# Patient Record
Sex: Male | Born: 1945 | Race: White | Hispanic: No | Marital: Married | Smoking: Former smoker
Health system: Southern US, Community
[De-identification: ages and names within clinical notes are randomized; demographics above are authoritative.]

## PROBLEM LIST (undated history)

## (undated) DIAGNOSIS — M79606 Pain in leg, unspecified: Secondary | ICD-10-CM

## (undated) DIAGNOSIS — F32A Depression, unspecified: Secondary | ICD-10-CM

## (undated) DIAGNOSIS — C7951 Secondary malignant neoplasm of bone: Secondary | ICD-10-CM

## (undated) DIAGNOSIS — N529 Male erectile dysfunction, unspecified: Secondary | ICD-10-CM

## (undated) DIAGNOSIS — G8929 Other chronic pain: Secondary | ICD-10-CM

## (undated) DIAGNOSIS — F329 Major depressive disorder, single episode, unspecified: Secondary | ICD-10-CM

## (undated) DIAGNOSIS — F0390 Unspecified dementia without behavioral disturbance: Secondary | ICD-10-CM

## (undated) DIAGNOSIS — C61 Malignant neoplasm of prostate: Secondary | ICD-10-CM

## (undated) HISTORY — PX: OTHER SURGICAL HISTORY: SHX169

## (undated) HISTORY — PX: MANDIBLE FRACTURE SURGERY: SHX706

---

## 2009-02-24 ENCOUNTER — Emergency Department (HOSPITAL_BASED_OUTPATIENT_CLINIC_OR_DEPARTMENT_OTHER): Admission: EM | Admit: 2009-02-24 | Discharge: 2009-02-24 | Payer: Self-pay | Admitting: Emergency Medicine

## 2009-02-24 ENCOUNTER — Ambulatory Visit: Payer: Self-pay | Admitting: Diagnostic Radiology

## 2010-04-24 LAB — URINALYSIS, ROUTINE W REFLEX MICROSCOPIC
Hgb urine dipstick: NEGATIVE
Nitrite: NEGATIVE
Protein, ur: NEGATIVE mg/dL
Specific Gravity, Urine: 1.034 — ABNORMAL HIGH (ref 1.005–1.030)
Urobilinogen, UA: 1 mg/dL (ref 0.0–1.0)

## 2010-04-24 LAB — COMPREHENSIVE METABOLIC PANEL
ALT: 34 U/L (ref 0–53)
AST: 24 U/L (ref 0–37)
Albumin: 4.6 g/dL (ref 3.5–5.2)
Alkaline Phosphatase: 65 U/L (ref 39–117)
Sodium: 143 mEq/L (ref 135–145)
Total Protein: 7.5 g/dL (ref 6.0–8.3)

## 2015-04-28 ENCOUNTER — Encounter (HOSPITAL_BASED_OUTPATIENT_CLINIC_OR_DEPARTMENT_OTHER): Payer: Self-pay | Admitting: *Deleted

## 2015-04-28 ENCOUNTER — Inpatient Hospital Stay (HOSPITAL_BASED_OUTPATIENT_CLINIC_OR_DEPARTMENT_OTHER)
Admission: EM | Admit: 2015-04-28 | Discharge: 2015-05-06 | DRG: 481 | Disposition: A | Discharge status: Skilled Nursing Facility | Payer: Medicare Other | Attending: Internal Medicine | Admitting: Internal Medicine

## 2015-04-28 ENCOUNTER — Emergency Department (HOSPITAL_BASED_OUTPATIENT_CLINIC_OR_DEPARTMENT_OTHER): Payer: Medicare Other

## 2015-04-28 DIAGNOSIS — S72001A Fracture of unspecified part of neck of right femur, initial encounter for closed fracture: Secondary | ICD-10-CM

## 2015-04-28 DIAGNOSIS — D6959 Other secondary thrombocytopenia: Secondary | ICD-10-CM | POA: Diagnosis present

## 2015-04-28 DIAGNOSIS — C61 Malignant neoplasm of prostate: Secondary | ICD-10-CM | POA: Diagnosis present

## 2015-04-28 DIAGNOSIS — F329 Major depressive disorder, single episode, unspecified: Secondary | ICD-10-CM | POA: Diagnosis present

## 2015-04-28 DIAGNOSIS — S72009A Fracture of unspecified part of neck of unspecified femur, initial encounter for closed fracture: Secondary | ICD-10-CM | POA: Diagnosis present

## 2015-04-28 DIAGNOSIS — S7290XA Unspecified fracture of unspecified femur, initial encounter for closed fracture: Secondary | ICD-10-CM | POA: Diagnosis present

## 2015-04-28 DIAGNOSIS — Z87891 Personal history of nicotine dependence: Secondary | ICD-10-CM

## 2015-04-28 DIAGNOSIS — C7951 Secondary malignant neoplasm of bone: Secondary | ICD-10-CM | POA: Diagnosis present

## 2015-04-28 DIAGNOSIS — G8929 Other chronic pain: Secondary | ICD-10-CM | POA: Diagnosis present

## 2015-04-28 DIAGNOSIS — M84451A Pathological fracture, right femur, initial encounter for fracture: Principal | ICD-10-CM | POA: Diagnosis present

## 2015-04-28 DIAGNOSIS — M898X9 Other specified disorders of bone, unspecified site: Secondary | ICD-10-CM | POA: Insufficient documentation

## 2015-04-28 DIAGNOSIS — R627 Adult failure to thrive: Secondary | ICD-10-CM | POA: Diagnosis present

## 2015-04-28 DIAGNOSIS — K5900 Constipation, unspecified: Secondary | ICD-10-CM | POA: Diagnosis present

## 2015-04-28 DIAGNOSIS — M25561 Pain in right knee: Secondary | ICD-10-CM | POA: Diagnosis not present

## 2015-04-28 DIAGNOSIS — M8440XA Pathological fracture, unspecified site, initial encounter for fracture: Secondary | ICD-10-CM | POA: Diagnosis present

## 2015-04-28 DIAGNOSIS — D62 Acute posthemorrhagic anemia: Secondary | ICD-10-CM | POA: Diagnosis not present

## 2015-04-28 DIAGNOSIS — F32A Depression, unspecified: Secondary | ICD-10-CM | POA: Diagnosis present

## 2015-04-28 DIAGNOSIS — S8990XA Unspecified injury of unspecified lower leg, initial encounter: Secondary | ICD-10-CM

## 2015-04-28 DIAGNOSIS — Z419 Encounter for procedure for purposes other than remedying health state, unspecified: Secondary | ICD-10-CM

## 2015-04-28 DIAGNOSIS — F0390 Unspecified dementia without behavioral disturbance: Secondary | ICD-10-CM | POA: Diagnosis present

## 2015-04-28 DIAGNOSIS — M79606 Pain in leg, unspecified: Secondary | ICD-10-CM

## 2015-04-28 DIAGNOSIS — Z66 Do not resuscitate: Secondary | ICD-10-CM | POA: Insufficient documentation

## 2015-04-28 DIAGNOSIS — Z515 Encounter for palliative care: Secondary | ICD-10-CM | POA: Insufficient documentation

## 2015-04-28 DIAGNOSIS — G4730 Sleep apnea, unspecified: Secondary | ICD-10-CM | POA: Diagnosis present

## 2015-04-28 DIAGNOSIS — N179 Acute kidney failure, unspecified: Secondary | ICD-10-CM | POA: Diagnosis present

## 2015-04-28 HISTORY — DX: Depression, unspecified: F32.A

## 2015-04-28 HISTORY — DX: Unspecified dementia, unspecified severity, without behavioral disturbance, psychotic disturbance, mood disturbance, and anxiety: F03.90

## 2015-04-28 HISTORY — DX: Major depressive disorder, single episode, unspecified: F32.9

## 2015-04-28 HISTORY — DX: Pain in leg, unspecified: M79.606

## 2015-04-28 HISTORY — DX: Secondary malignant neoplasm of bone: C61

## 2015-04-28 HISTORY — DX: Other chronic pain: G89.29

## 2015-04-28 HISTORY — DX: Secondary malignant neoplasm of bone: C79.51

## 2015-04-28 HISTORY — DX: Male erectile dysfunction, unspecified: N52.9

## 2015-04-28 NOTE — ED Provider Notes (Signed)
CSN: [id number]     Arrival date & time 04/28/15  2147 History   First MD Initiated Contact with Patient 04/28/15 2327     Chief Complaint  Patient presents with  . Leg Injury     (Consider location/radiation/quality/duration/timing/severity/associated sxs/prior Treatment) HPI Comments: Patient with a history of metastatic prostate cancer was reaching for the phone while lying in bed this evening when he felt and heard a "pop" in his right leg.  He noted swelling of the inner aspect of the thigh just after the incident.  He was able to ambulate down the stairs with assistance.  Patient recently completed a PET scan and MRI at the [place]. He has not yet been notified of the results.  Patient is a 70 y.o. male presenting with leg pain.  Leg Pain Location:  Leg Leg location:  R leg Pain details:    Quality:  Unable to specify   Severity:  Mild   Onset quality:  Sudden Chronicity:  New   Past Medical History  Diagnosis Date  . Cancer [facility])    History reviewed. No pertinent past surgical history. No family history on file. Social History  Substance Use Topics  . Smoking status: Never Smoker   . Smokeless tobacco: None  . Alcohol Use: No    Review of Systems  Musculoskeletal: Positive for arthralgias.  All other systems reviewed and are negative.     Allergies  Review of patient's allergies indicates no known allergies.  Home Medications   Prior to Admission medications   Medication Sig Start Date End Date Taking? Authorizing Provider  GABAPENTIN, ONCE-DAILY, PO Take by mouth.   Yes Historical Provider, MD   BP 112/62 mmHg  Pulse 72  Temp(Src) 97.8 F (36.6 C) (Oral)  Resp 18  Ht 5\' 10"  (1.778 m)  Wt 74.844 kg  BMI 23.68 kg/m2  SpO2 95% Physical Exam  Constitutional: He is oriented to person, place, and time. He appears well-developed and well-nourished.  HENT:  Head: Normocephalic and atraumatic.  Eyes: Pupils are equal, round, and reactive to light.  Neck:  Neck supple.  Cardiovascular: Normal rate and regular rhythm.   Pulmonary/Chest: Effort normal and breath sounds normal.  Abdominal: Soft.  Musculoskeletal: He exhibits edema and tenderness.  Neurological: He is alert and oriented to person, place, and time.  Skin: Skin is warm and dry.  Psychiatric: He has a normal mood and affect.  Nursing note and vitals reviewed.   ED Course  Procedures (including critical care time) Labs Review Labs Reviewed - No data to display  Imaging Review Ct Hip Right Wo Contrast  04/29/2015  CLINICAL DATA:  Right hip pain. Lesser trochanter fracture on plain films. EXAM: CT OF THE RIGHT HIP WITHOUT CONTRAST TECHNIQUE: Multidetector CT imaging of the right hip was performed according to the standard protocol. Multiplanar CT image reconstructions were also generated. COMPARISON:  Right femur 04/28/2015 FINDINGS: Again demonstrated is a fracture of the lesser trochanter with superior and medial displacement of the lesser trochanteric fragment. In addition, there is a linear lucency through the base of the femoral neck consistent with a nondisplaced fracture. Linear lucency also extends obliquely across the base of the greater trochanter. No evidence of dislocation. On at 50 diffuse abnormal bone density is demonstrated with a heterogeneous sclerosis and lucency with irregular margination and irregular cortex demonstrated throughout the visualized right pelvis and with heterogeneous lucency and sclerosis in the femoral head and neck. This is consistent with underlying infiltrative bone process.  Suspect metastatic involvement although myeloma or lymphoma could also possibly have this appearance. Enlarged lymph nodes are demonstrated in the right iliac chains, measuring up to 23 mm short axis dimension. Musculature medial to the proximal femur is somewhat prominent with hazy margins, suggesting intramuscular hematoma. IMPRESSION: Displaced fracture of the lesser trochanter.  Additional linear nondisplaced fractures of the base of the femoral neck and of the base of the greater trochanter. There is underlying abnormal bone mineralization consistent with pathologic fractures. Bone sclerosis and lucency with cortical irregularity may indicate metastasis, myeloma, or other infiltrating bone process. Enlarged lymph nodes are present in the right iliac chains suggesting metastatic lymphadenopathy. Electronically Signed   By: Lucienne Capers M.D.   On: 04/29/2015 02:36   Dg Knee Complete 4 Views Right  04/28/2015  CLINICAL DATA:  70 year old male with right knee pain after feeling a pop in the right leg. EXAM: RIGHT KNEE - COMPLETE 4+ VIEW COMPARISON:  None. FINDINGS: There is no acute fracture or dislocation. There is mild osteopenia. No significant arthritic changes. There is no joint effusion. The soft tissues appear unremarkable. IMPRESSION: No acute fracture or dislocation. Electronically Signed   By: Anner Crete M.D.   On: 04/28/2015 23:13   Dg Femur, Min 2 Views Right  04/28/2015  CLINICAL DATA:  Right femoral pain and tightness after making a sudden movement while lying down. EXAM: RIGHT FEMUR 2 VIEWS COMPARISON:  None. FINDINGS: There is a fracture of the lesser trochanter of the right hip with superior displacement of the fracture fragment. No evidence of femoral neck, shaft, or distal femoral fractures. Heterogeneous sclerotic appearance of the right superior and inferior pubic rami suggesting underlying bone disease. This could represent Paget's disease or metastasis. This is possibly a pathologic fracture. IMPRESSION: Displaced fracture of the lesser trochanter of the right femur, possibly pathologic. Abnormal bone sclerosis in the superior and inferior pubic rami suggesting Paget's disease or infiltrating bone disease such as metastasis. Electronically Signed   By: Lucienne Capers M.D.   On: 04/28/2015 23:13   I have personally reviewed and evaluated these images  and lab results as part of my medical decision-making.   EKG Interpretation None     Discussed patient with orthopedics ([facility]) after reviewing plain films--lesser trochanter fracture noted.  He requests CT of hip to further assess for occult fractures. CT reveals displaced fracture of lesser trochanter, along with linear non-displaced fractures of base of femoral neck and base of greater trochanter. Patient to be admitted to medicine at [facility] per request of orthopedist.  MDM   Final diagnoses:  Leg injury   Hip fracture. Admitted to medicine. Ortho to see in the morning.     Etta Quill, NP 04/29/15 [place], MD 04/29/15 [phone]

## 2015-04-28 NOTE — ED Notes (Signed)
He was laying in bed trying to reach for the phone and felt a pop in his right upper leg. Swelling and pain above his knee. He was able to walk down stairs with assistance afterward.

## 2015-04-29 ENCOUNTER — Emergency Department (HOSPITAL_BASED_OUTPATIENT_CLINIC_OR_DEPARTMENT_OTHER): Payer: Medicare Other

## 2015-04-29 ENCOUNTER — Encounter (HOSPITAL_COMMUNITY): Payer: Self-pay | Admitting: Internal Medicine

## 2015-04-29 ENCOUNTER — Inpatient Hospital Stay (HOSPITAL_COMMUNITY): Payer: Medicare Other | Admitting: Anesthesiology

## 2015-04-29 ENCOUNTER — Inpatient Hospital Stay (HOSPITAL_COMMUNITY): Payer: Medicare Other

## 2015-04-29 ENCOUNTER — Inpatient Hospital Stay (HOSPITAL_BASED_OUTPATIENT_CLINIC_OR_DEPARTMENT_OTHER): Payer: Medicare Other

## 2015-04-29 ENCOUNTER — Encounter (HOSPITAL_COMMUNITY)
Admission: EM | Disposition: A | Discharge status: Skilled Nursing Facility | Payer: Self-pay | Source: Home / Self Care | Attending: Internal Medicine

## 2015-04-29 DIAGNOSIS — C61 Malignant neoplasm of prostate: Secondary | ICD-10-CM | POA: Diagnosis present

## 2015-04-29 DIAGNOSIS — S7290XA Unspecified fracture of unspecified femur, initial encounter for closed fracture: Secondary | ICD-10-CM | POA: Diagnosis present

## 2015-04-29 DIAGNOSIS — D62 Acute posthemorrhagic anemia: Secondary | ICD-10-CM | POA: Diagnosis not present

## 2015-04-29 DIAGNOSIS — F0390 Unspecified dementia without behavioral disturbance: Secondary | ICD-10-CM | POA: Diagnosis present

## 2015-04-29 DIAGNOSIS — D6959 Other secondary thrombocytopenia: Secondary | ICD-10-CM | POA: Diagnosis present

## 2015-04-29 DIAGNOSIS — F329 Major depressive disorder, single episode, unspecified: Secondary | ICD-10-CM | POA: Diagnosis present

## 2015-04-29 DIAGNOSIS — K5901 Slow transit constipation: Secondary | ICD-10-CM | POA: Diagnosis not present

## 2015-04-29 DIAGNOSIS — Z66 Do not resuscitate: Secondary | ICD-10-CM | POA: Diagnosis not present

## 2015-04-29 DIAGNOSIS — F32A Depression, unspecified: Secondary | ICD-10-CM | POA: Diagnosis present

## 2015-04-29 DIAGNOSIS — K5900 Constipation, unspecified: Secondary | ICD-10-CM | POA: Diagnosis present

## 2015-04-29 DIAGNOSIS — M79606 Pain in leg, unspecified: Secondary | ICD-10-CM | POA: Diagnosis not present

## 2015-04-29 DIAGNOSIS — S72009A Fracture of unspecified part of neck of unspecified femur, initial encounter for closed fracture: Secondary | ICD-10-CM | POA: Diagnosis present

## 2015-04-29 DIAGNOSIS — R627 Adult failure to thrive: Secondary | ICD-10-CM | POA: Diagnosis present

## 2015-04-29 DIAGNOSIS — S72001A Fracture of unspecified part of neck of right femur, initial encounter for closed fracture: Secondary | ICD-10-CM | POA: Diagnosis present

## 2015-04-29 DIAGNOSIS — M25561 Pain in right knee: Secondary | ICD-10-CM | POA: Diagnosis present

## 2015-04-29 DIAGNOSIS — G4730 Sleep apnea, unspecified: Secondary | ICD-10-CM | POA: Diagnosis present

## 2015-04-29 DIAGNOSIS — Z87891 Personal history of nicotine dependence: Secondary | ICD-10-CM | POA: Diagnosis not present

## 2015-04-29 DIAGNOSIS — G8929 Other chronic pain: Secondary | ICD-10-CM | POA: Diagnosis present

## 2015-04-29 DIAGNOSIS — M84451A Pathological fracture, right femur, initial encounter for fracture: Secondary | ICD-10-CM | POA: Diagnosis present

## 2015-04-29 DIAGNOSIS — M8440XA Pathological fracture, unspecified site, initial encounter for fracture: Secondary | ICD-10-CM

## 2015-04-29 DIAGNOSIS — Z515 Encounter for palliative care: Secondary | ICD-10-CM | POA: Diagnosis not present

## 2015-04-29 DIAGNOSIS — C7951 Secondary malignant neoplasm of bone: Secondary | ICD-10-CM

## 2015-04-29 DIAGNOSIS — S72001D Fracture of unspecified part of neck of right femur, subsequent encounter for closed fracture with routine healing: Secondary | ICD-10-CM | POA: Diagnosis not present

## 2015-04-29 DIAGNOSIS — N179 Acute kidney failure, unspecified: Secondary | ICD-10-CM | POA: Diagnosis present

## 2015-04-29 DIAGNOSIS — M899 Disorder of bone, unspecified: Secondary | ICD-10-CM | POA: Diagnosis not present

## 2015-04-29 DIAGNOSIS — M8440XD Pathological fracture, unspecified site, subsequent encounter for fracture with routine healing: Secondary | ICD-10-CM | POA: Diagnosis not present

## 2015-04-29 HISTORY — PX: INTRAMEDULLARY (IM) NAIL INTERTROCHANTERIC: SHX5875

## 2015-04-29 LAB — HEPATIC FUNCTION PANEL
ALK PHOS: 177 U/L — AB (ref 38–126)
ALT: 18 U/L (ref 17–63)
AST: 45 U/L — ABNORMAL HIGH (ref 15–41)
Albumin: 3.5 g/dL (ref 3.5–5.0)
BILIRUBIN DIRECT: 0.1 mg/dL (ref 0.1–0.5)
BILIRUBIN INDIRECT: 0.4 mg/dL (ref 0.3–0.9)
BILIRUBIN TOTAL: 0.5 mg/dL (ref 0.3–1.2)
TOTAL PROTEIN: 6.3 g/dL — AB (ref 6.5–8.1)

## 2015-04-29 LAB — CREATININE, SERUM
Creatinine, Ser: 0.96 mg/dL (ref 0.61–1.24)
GFR calc Af Amer: >60 mL/min (ref >60)
GFR calc non Af Amer: >60 mL/min (ref >60)

## 2015-04-29 LAB — CBC WITH DIFFERENTIAL/PLATELET
BASOS ABS: 0 10*3/uL (ref 0.0–0.1)
BASOS PCT: 0 %
Eosinophils Absolute: 0 10*3/uL (ref 0.0–0.7)
Eosinophils Relative: 0 %
HCT: 27.8 % — ABNORMAL LOW (ref 39.0–52.0)
Hemoglobin: 9.1 g/dL — ABNORMAL LOW (ref 13.0–17.0)
LYMPHS ABS: 0.6 10*3/uL — AB (ref 0.7–4.0)
Lymphocytes Relative: 11 %
MCH: 30.1 pg (ref 26.0–34.0)
MCHC: 32.7 g/dL (ref 30.0–36.0)
MCV: 92.1 fL (ref 78.0–100.0)
MONOS PCT: 8 %
Monocytes Absolute: 0.4 10*3/uL (ref 0.1–1.0)
NEUTROS ABS: 4.6 10*3/uL (ref 1.7–7.7)
Neutrophils Relative %: 81 %
PLATELETS: 86 10*3/uL — AB (ref 150–400)
RBC: 3.02 MIL/uL — ABNORMAL LOW (ref 4.22–5.81)
RDW: 14.4 % (ref 11.5–15.5)
WBC: 5.6 10*3/uL (ref 4.0–10.5)

## 2015-04-29 LAB — CBC
HEMATOCRIT: 28.8 % — AB (ref 39.0–52.0)
Hemoglobin: 9.9 g/dL — ABNORMAL LOW (ref 13.0–17.0)
MCH: 30.7 pg (ref 26.0–34.0)
MCHC: 34.4 g/dL (ref 30.0–36.0)
MCV: 89.4 fL (ref 78.0–100.0)
PLATELETS: 77 10*3/uL — AB (ref 150–400)
RBC: 3.22 MIL/uL — AB (ref 4.22–5.81)
RDW: 14.2 % (ref 11.5–15.5)
WBC: 8.2 10*3/uL (ref 4.0–10.5)

## 2015-04-29 LAB — PROTIME-INR
INR: 1.1 (ref 0.00–1.49)
Prothrombin Time: 14.4 seconds (ref 11.6–15.2)

## 2015-04-29 LAB — BASIC METABOLIC PANEL
ANION GAP: 10 (ref 5–15)
BUN: 19 mg/dL (ref 6–20)
CALCIUM: 7.9 mg/dL — AB (ref 8.9–10.3)
CO2: 22 mmol/L (ref 22–32)
CREATININE: 0.87 mg/dL (ref 0.61–1.24)
Chloride: 104 mmol/L (ref 101–111)
GFR calc Af Amer: >60 mL/min (ref >60)
GLUCOSE: 121 mg/dL — AB (ref 65–99)
Potassium: 4.4 mmol/L (ref 3.5–5.1)
Sodium: 136 mmol/L (ref 135–145)

## 2015-04-29 LAB — PREPARE RBC (CROSSMATCH)

## 2015-04-29 LAB — ABO/RH: ABO/RH(D): A POS

## 2015-04-29 SURGERY — FIXATION, FRACTURE, INTERTROCHANTERIC, WITH INTRAMEDULLARY ROD
Anesthesia: General | Site: Hip | Laterality: Right

## 2015-04-29 MED ORDER — LACTATED RINGERS IV SOLN
INTRAVENOUS | Status: DC | PRN
  Administered 2015-04-29: 18:00:00 via INTRAVENOUS

## 2015-04-29 MED ORDER — METOCLOPRAMIDE HCL 5 MG/ML IJ SOLN: 5.0000 mg | Freq: Three times a day (TID) | INTRAMUSCULAR | Status: DC | PRN

## 2015-04-29 MED ORDER — MIDAZOLAM HCL 2 MG/2ML IJ SOLN
INTRAMUSCULAR | Status: AC
  Filled 2015-04-29: qty 2

## 2015-04-29 MED ORDER — DEXAMETHASONE SODIUM PHOSPHATE 10 MG/ML IJ SOLN
INTRAMUSCULAR | Status: AC
  Filled 2015-04-29: qty 1

## 2015-04-29 MED ORDER — ACETAMINOPHEN 325 MG PO TABS
650.0000 mg | ORAL_TABLET | Freq: Four times a day (QID) | ORAL | Status: DC | PRN
  Administered 2015-04-30 – 2015-05-01 (×2): 650 mg via ORAL
  Filled 2015-04-29 (×2): qty 2

## 2015-04-29 MED ORDER — MENTHOL 3 MG MT LOZG: 1.0000 | LOZENGE | OROMUCOSAL | Status: DC | PRN

## 2015-04-29 MED ORDER — CEFAZOLIN SODIUM-DEXTROSE 2-3 GM-% IV SOLR
INTRAVENOUS | Status: DC | PRN
  Administered 2015-04-29: 2 g via INTRAVENOUS

## 2015-04-29 MED ORDER — FENTANYL CITRATE (PF) 100 MCG/2ML IJ SOLN
INTRAMUSCULAR | Status: AC
  Filled 2015-04-29: qty 2

## 2015-04-29 MED ORDER — SODIUM CHLORIDE 0.9 % IV SOLN: 10.0000 mL/h | Freq: Once | INTRAVENOUS | Status: DC

## 2015-04-29 MED ORDER — METHOCARBAMOL 1000 MG/10ML IJ SOLN
500.0000 mg | Freq: Four times a day (QID) | INTRAVENOUS | Status: DC | PRN
  Administered 2015-04-29: 500 mg via INTRAVENOUS
  Filled 2015-04-29 (×3): qty 5

## 2015-04-29 MED ORDER — OXYCODONE HCL 5 MG PO TABS
5.0000 mg | ORAL_TABLET | ORAL | Status: DC | PRN
  Administered 2015-04-30 – 2015-05-03 (×4): 10 mg via ORAL
  Filled 2015-04-29 (×4): qty 2

## 2015-04-29 MED ORDER — MIDAZOLAM HCL 2 MG/2ML IJ SOLN: 0.5000 mg | Freq: Once | INTRAMUSCULAR | Status: DC | PRN

## 2015-04-29 MED ORDER — PROPOFOL 10 MG/ML IV BOLUS
INTRAVENOUS | Status: DC | PRN
  Administered 2015-04-29: 110 mg via INTRAVENOUS

## 2015-04-29 MED ORDER — ACETAMINOPHEN 650 MG RE SUPP: 650.0000 mg | Freq: Four times a day (QID) | RECTAL | Status: DC | PRN

## 2015-04-29 MED ORDER — ONDANSETRON HCL 4 MG/2ML IJ SOLN: 4.0000 mg | Freq: Four times a day (QID) | INTRAMUSCULAR | Status: DC | PRN

## 2015-04-29 MED ORDER — CALCIUM CARBONATE-VITAMIN D 600-400 MG-UNIT PO TABS: 1.0000 | ORAL_TABLET | Freq: Every day | ORAL | Status: DC

## 2015-04-29 MED ORDER — METHOCARBAMOL 1000 MG/10ML IJ SOLN
500.0000 mg | Freq: Four times a day (QID) | INTRAVENOUS | Status: DC | PRN
  Filled 2015-04-29: qty 5

## 2015-04-29 MED ORDER — MORPHINE SULFATE (PF) 2 MG/ML IV SOLN
0.5000 mg | INTRAVENOUS | Status: DC | PRN
  Administered 2015-04-29: 0.5 mg via INTRAVENOUS
  Filled 2015-04-29: qty 1

## 2015-04-29 MED ORDER — PROPOFOL 10 MG/ML IV BOLUS
INTRAVENOUS | Status: AC
  Filled 2015-04-29: qty 20

## 2015-04-29 MED ORDER — SERTRALINE HCL 100 MG PO TABS
100.0000 mg | ORAL_TABLET | Freq: Every day | ORAL | Status: DC
  Administered 2015-04-30 – 2015-05-06 (×7): 100 mg via ORAL
  Filled 2015-04-29 (×8): qty 1

## 2015-04-29 MED ORDER — LIDOCAINE HCL (CARDIAC) 20 MG/ML IV SOLN
INTRAVENOUS | Status: DC | PRN
  Administered 2015-04-29: 75 mg via INTRAVENOUS
  Administered 2015-04-29: 25 mg via INTRATRACHEAL

## 2015-04-29 MED ORDER — CALCIUM CARBONATE-VITAMIN D 500-200 MG-UNIT PO TABS
1.0000 | ORAL_TABLET | Freq: Every day | ORAL | Status: DC
Start: 2015-04-29 — End: 2015-05-06
  Administered 2015-04-30 – 2015-05-06 (×7): 1 via ORAL
  Filled 2015-04-29 (×8): qty 1

## 2015-04-29 MED ORDER — CEFAZOLIN SODIUM-DEXTROSE 2-4 GM/100ML-% IV SOLN
2.0000 g | Freq: Once | INTRAVENOUS | Status: DC
  Filled 2015-04-29: qty 100

## 2015-04-29 MED ORDER — HYDROMORPHONE HCL 1 MG/ML IJ SOLN
1.0000 mg | Freq: Once | INTRAMUSCULAR | Status: AC
  Administered 2015-04-29: 1 mg via INTRAVENOUS
  Filled 2015-04-29: qty 1

## 2015-04-29 MED ORDER — SODIUM CHLORIDE 0.9 % IV SOLN
INTRAVENOUS | Status: DC
Start: 2015-04-29 — End: 2015-05-03
  Administered 2015-04-29: 19:00:00 via INTRAVENOUS
  Administered 2015-04-29: 100 mL/h via INTRAVENOUS
  Administered 2015-04-30 – 2015-05-01 (×2): via INTRAVENOUS

## 2015-04-29 MED ORDER — CEFAZOLIN SODIUM-DEXTROSE 2-4 GM/100ML-% IV SOLN
2.0000 g | Freq: Four times a day (QID) | INTRAVENOUS | Status: AC
  Administered 2015-04-29 – 2015-04-30 (×3): 2 g via INTRAVENOUS
  Filled 2015-04-29 (×3): qty 100

## 2015-04-29 MED ORDER — GABAPENTIN 100 MG PO CAPS
100.0000 mg | ORAL_CAPSULE | Freq: Every day | ORAL | Status: DC
  Administered 2015-04-30 – 2015-05-01 (×2): 200 mg via ORAL
  Administered 2015-05-02 – 2015-05-03 (×2): 100 mg via ORAL
  Administered 2015-05-04 – 2015-05-05 (×2): 200 mg via ORAL
  Filled 2015-04-29 (×8): qty 2

## 2015-04-29 MED ORDER — DICLOFENAC SODIUM 75 MG PO TBEC
75.0000 mg | DELAYED_RELEASE_TABLET | Freq: Two times a day (BID) | ORAL | Status: DC
  Administered 2015-04-30 – 2015-05-06 (×13): 75 mg via ORAL
  Filled 2015-04-29 (×15): qty 1

## 2015-04-29 MED ORDER — MORPHINE SULFATE (PF) 2 MG/ML IV SOLN
0.5000 mg | INTRAVENOUS | Status: DC | PRN
  Administered 2015-04-29 (×2): 0.5 mg via INTRAVENOUS
  Filled 2015-04-29 (×2): qty 1

## 2015-04-29 MED ORDER — PHENYLEPHRINE HCL 10 MG/ML IJ SOLN
INTRAMUSCULAR | Status: AC
  Filled 2015-04-29: qty 1

## 2015-04-29 MED ORDER — 0.9 % SODIUM CHLORIDE (POUR BTL) OPTIME
TOPICAL | Status: DC | PRN
  Administered 2015-04-29: 1000 mL

## 2015-04-29 MED ORDER — ISOPROPYL ALCOHOL 70 % SOLN
Status: AC
  Filled 2015-04-29: qty 480

## 2015-04-29 MED ORDER — GLUCOSAMINE HCL 1000 MG PO TABS: 1.0000 | ORAL_TABLET | Freq: Every day | ORAL | Status: DC

## 2015-04-29 MED ORDER — DONEPEZIL HCL 10 MG PO TABS
10.0000 mg | ORAL_TABLET | Freq: Every day | ORAL | Status: DC
  Administered 2015-04-30 – 2015-05-05 (×6): 10 mg via ORAL
  Filled 2015-04-29 (×8): qty 1

## 2015-04-29 MED ORDER — IBUPROFEN 200 MG PO TABS: 200.0000 mg | ORAL_TABLET | Freq: Four times a day (QID) | ORAL | Status: DC | PRN

## 2015-04-29 MED ORDER — METOCLOPRAMIDE HCL 10 MG PO TABS: 5.0000 mg | ORAL_TABLET | Freq: Three times a day (TID) | ORAL | Status: DC | PRN

## 2015-04-29 MED ORDER — METHOCARBAMOL 500 MG PO TABS: 500.0000 mg | ORAL_TABLET | Freq: Four times a day (QID) | ORAL | Status: DC | PRN

## 2015-04-29 MED ORDER — ROCURONIUM BROMIDE 100 MG/10ML IV SOLN
INTRAVENOUS | Status: DC | PRN
  Administered 2015-04-29: 20 mg via INTRAVENOUS

## 2015-04-29 MED ORDER — ALUM & MAG HYDROXIDE-SIMETH 200-200-20 MG/5ML PO SUSP: 30.0000 mL | ORAL | Status: DC | PRN

## 2015-04-29 MED ORDER — HYDROCODONE-ACETAMINOPHEN 5-325 MG PO TABS: 1.0000 | ORAL_TABLET | Freq: Two times a day (BID) | ORAL | Status: DC | PRN

## 2015-04-29 MED ORDER — ENOXAPARIN SODIUM 40 MG/0.4ML ~~LOC~~ SOLN
40.0000 mg | SUBCUTANEOUS | Status: DC
  Filled 2015-04-29: qty 0.4

## 2015-04-29 MED ORDER — ONDANSETRON HCL 4 MG PO TABS
4.0000 mg | ORAL_TABLET | Freq: Four times a day (QID) | ORAL | Status: DC | PRN
  Administered 2015-04-30: 4 mg via ORAL
  Filled 2015-04-29: qty 1

## 2015-04-29 MED ORDER — GLYCOPYRROLATE 0.2 MG/ML IJ SOLN
INTRAMUSCULAR | Status: DC | PRN
  Administered 2015-04-29 (×2): 0.2 mg via INTRAVENOUS

## 2015-04-29 MED ORDER — HYDROCODONE-ACETAMINOPHEN 5-325 MG PO TABS
1.0000 | ORAL_TABLET | Freq: Four times a day (QID) | ORAL | Status: DC | PRN
  Administered 2015-04-30: 2 via ORAL
  Administered 2015-04-30 – 2015-05-06 (×9): 1 via ORAL
  Filled 2015-04-29: qty 2
  Filled 2015-04-29 (×9): qty 1

## 2015-04-29 MED ORDER — LIP MEDEX EX OINT
TOPICAL_OINTMENT | CUTANEOUS | Status: AC
  Administered 2015-04-29: 1
  Filled 2015-04-29: qty 7

## 2015-04-29 MED ORDER — POLYETHYLENE GLYCOL 3350 17 G PO PACK: 17.0000 g | PACK | Freq: Every day | ORAL | Status: DC | PRN

## 2015-04-29 MED ORDER — PHENOL 1.4 % MT LIQD: 1.0000 | OROMUCOSAL | Status: DC | PRN

## 2015-04-29 MED ORDER — ENOXAPARIN SODIUM 40 MG/0.4ML ~~LOC~~ SOLN: 40.0000 mg | SUBCUTANEOUS | Status: DC

## 2015-04-29 MED ORDER — SUCCINYLCHOLINE CHLORIDE 20 MG/ML IJ SOLN
INTRAMUSCULAR | Status: DC | PRN
  Administered 2015-04-29: 80 mg via INTRAVENOUS

## 2015-04-29 MED ORDER — FENTANYL CITRATE (PF) 250 MCG/5ML IJ SOLN
INTRAMUSCULAR | Status: AC
  Filled 2015-04-29: qty 5

## 2015-04-29 MED ORDER — OXYCODONE-ACETAMINOPHEN 5-325 MG PO TABS: 1.0000 | ORAL_TABLET | ORAL | Status: AC | PRN

## 2015-04-29 MED ORDER — METHOCARBAMOL 500 MG PO TABS
500.0000 mg | ORAL_TABLET | Freq: Four times a day (QID) | ORAL | Status: DC | PRN
  Administered 2015-04-30 – 2015-05-05 (×5): 500 mg via ORAL
  Filled 2015-04-29 (×5): qty 1

## 2015-04-29 MED ORDER — HYDROCODONE-ACETAMINOPHEN 5-325 MG PO TABS: 1.0000 | ORAL_TABLET | Freq: Four times a day (QID) | ORAL | Status: DC | PRN

## 2015-04-29 MED ORDER — SODIUM CHLORIDE 0.9 % IV SOLN
INTRAVENOUS | Status: DC
  Administered 2015-04-30: 01:00:00 via INTRAVENOUS

## 2015-04-29 MED ORDER — PHENYLEPHRINE HCL 10 MG/ML IJ SOLN
10.0000 mg | INTRAVENOUS | Status: DC | PRN
  Administered 2015-04-29: 50 ug/min via INTRAVENOUS

## 2015-04-29 MED ORDER — ONDANSETRON HCL 4 MG/2ML IJ SOLN
INTRAMUSCULAR | Status: DC | PRN
  Administered 2015-04-29: 4 mg via INTRAVENOUS

## 2015-04-29 MED ORDER — ISOPROPYL ALCOHOL 70 % SOLN
Status: DC | PRN
  Administered 2015-04-29: 1 via TOPICAL

## 2015-04-29 MED ORDER — MEPERIDINE HCL 50 MG/ML IJ SOLN: 6.2500 mg | INTRAMUSCULAR | Status: DC | PRN

## 2015-04-29 MED ORDER — CEFAZOLIN SODIUM-DEXTROSE 2-3 GM-% IV SOLR
INTRAVENOUS | Status: AC
  Filled 2015-04-29: qty 50

## 2015-04-29 MED ORDER — ENOXAPARIN SODIUM 40 MG/0.4ML ~~LOC~~ SOLN: 40.0000 mg | Freq: Every day | SUBCUTANEOUS | Status: DC

## 2015-04-29 MED ORDER — ONDANSETRON HCL 4 MG/2ML IJ SOLN
INTRAMUSCULAR | Status: AC
  Filled 2015-04-29: qty 2

## 2015-04-29 MED ORDER — FENTANYL CITRATE (PF) 100 MCG/2ML IJ SOLN
50.0000 ug | Freq: Once | INTRAMUSCULAR | Status: AC
  Administered 2015-04-29: 50 ug via INTRAVENOUS
  Filled 2015-04-29: qty 2

## 2015-04-29 MED ORDER — LIDOCAINE HCL (CARDIAC) 20 MG/ML IV SOLN
INTRAVENOUS | Status: AC
  Filled 2015-04-29: qty 5

## 2015-04-29 MED ORDER — FENTANYL CITRATE (PF) 100 MCG/2ML IJ SOLN
25.0000 ug | INTRAMUSCULAR | Status: DC | PRN
  Administered 2015-04-29 (×3): 50 ug via INTRAVENOUS

## 2015-04-29 MED ORDER — FENTANYL CITRATE (PF) 100 MCG/2ML IJ SOLN
INTRAMUSCULAR | Status: DC | PRN
  Administered 2015-04-29 (×3): 50 ug via INTRAVENOUS
  Administered 2015-04-29: 100 ug via INTRAVENOUS

## 2015-04-29 MED ORDER — PROMETHAZINE HCL 25 MG/ML IJ SOLN: 6.2500 mg | INTRAMUSCULAR | Status: DC | PRN

## 2015-04-29 MED ORDER — ENOXAPARIN SODIUM 40 MG/0.4ML ~~LOC~~ SOLN
40.0000 mg | SUBCUTANEOUS | Status: DC
  Administered 2015-04-30: 40 mg via SUBCUTANEOUS
  Filled 2015-04-29 (×3): qty 0.4

## 2015-04-29 MED ORDER — SILDENAFIL CITRATE 100 MG PO TABS: 100.0000 mg | ORAL_TABLET | Freq: Every day | ORAL | Status: DC | PRN

## 2015-04-29 MED ORDER — HYDROMORPHONE HCL 1 MG/ML IJ SOLN
0.2500 mg | INTRAMUSCULAR | Status: DC | PRN
  Administered 2015-04-29: 0.25 mg via INTRAVENOUS
  Administered 2015-04-29: 0.5 mg via INTRAVENOUS
  Administered 2015-04-29: 0.25 mg via INTRAVENOUS

## 2015-04-29 MED ORDER — NEOSTIGMINE METHYLSULFATE 10 MG/10ML IV SOLN
INTRAVENOUS | Status: DC | PRN
  Administered 2015-04-29: 2 mg via INTRAVENOUS

## 2015-04-29 MED ORDER — HYDROMORPHONE HCL 1 MG/ML IJ SOLN
INTRAMUSCULAR | Status: AC
  Filled 2015-04-29: qty 1

## 2015-04-29 SURGICAL SUPPLY — 43 items
BLADE SURG 15 STRL LF DISP TIS (BLADE) IMPLANT
BLADE SURG 15 STRL SS (BLADE)
BNDG COHESIVE 6X5 TAN STRL LF (GAUZE/BANDAGES/DRESSINGS) ×2 IMPLANT
BNDG GAUZE ELAST 4 BULKY (GAUZE/BANDAGES/DRESSINGS) IMPLANT
COVER SURGICAL LIGHT HANDLE (MISCELLANEOUS) IMPLANT
DRAPE PROXIMA HALF (DRAPES) IMPLANT
DRAPE STERI IOBAN 125X83 (DRAPES) ×2 IMPLANT
DRSG MEPILEX BORDER 4X4 (GAUZE/BANDAGES/DRESSINGS) ×4 IMPLANT
DRSG MEPILEX BORDER 4X8 (GAUZE/BANDAGES/DRESSINGS) IMPLANT
DRSG PAD ABDOMINAL 8X10 ST (GAUZE/BANDAGES/DRESSINGS) IMPLANT
DURAPREP 26ML APPLICATOR (WOUND CARE) ×2 IMPLANT
ELECT REM PT RETURN 9FT ADLT (ELECTROSURGICAL) ×2
ELECTRODE REM PT RTRN 9FT ADLT (ELECTROSURGICAL) ×1 IMPLANT
FACESHIELD WRAPAROUND (MASK) ×2 IMPLANT
GAUZE XEROFORM 5X9 LF (GAUZE/BANDAGES/DRESSINGS) IMPLANT
GLOVE BIOGEL PI IND STRL 8 (GLOVE) ×2 IMPLANT
GLOVE BIOGEL PI INDICATOR 8 (GLOVE) ×2
GLOVE ECLIPSE 8.0 STRL XLNG CF (GLOVE) ×2 IMPLANT
GLOVE ORTHO TXT STRL SZ7.5 (GLOVE) ×2 IMPLANT
GOWN STRL REUS W/ TWL XL LVL3 (GOWN DISPOSABLE) ×2 IMPLANT
GOWN STRL REUS W/TWL LRG LVL3 (GOWN DISPOSABLE) IMPLANT
GOWN STRL REUS W/TWL XL LVL3 (GOWN DISPOSABLE) ×2
GUIDE PIN 3.2X343 (PIN) ×1
GUIDE PIN 3.2X343MM (PIN) ×1
KIT BASIN OR (CUSTOM PROCEDURE TRAY) ×2 IMPLANT
KIT ROOM TURNOVER OR (KITS) IMPLANT
MANIFOLD NEPTUNE II (INSTRUMENTS) IMPLANT
NAIL RIGHT 10X38MM (Nail) ×2 IMPLANT
NS IRRIG 1000ML POUR BTL (IV SOLUTION) ×2 IMPLANT
PACK GENERAL/GYN (CUSTOM PROCEDURE TRAY) ×2 IMPLANT
PAD ARMBOARD 7.5X6 YLW CONV (MISCELLANEOUS) ×4 IMPLANT
PAD CAST 4YDX4 CTTN HI CHSV (CAST SUPPLIES) IMPLANT
PADDING CAST COTTON 4X4 STRL (CAST SUPPLIES)
PIN GUIDE 3.2X343MM (PIN) ×1 IMPLANT
SCREW LAG COMPR KIT 85/80 (Screw) ×2 IMPLANT
STAPLER VISISTAT 35W (STAPLE) ×2 IMPLANT
SUT VIC AB 0 CT1 27 (SUTURE)
SUT VIC AB 0 CT1 27XBRD ANBCTR (SUTURE) IMPLANT
SUT VIC AB 0 CT1 36 (SUTURE) ×2 IMPLANT
SUT VIC AB 2-0 CT1 27 (SUTURE) ×2
SUT VIC AB 2-0 CT1 TAPERPNT 27 (SUTURE) ×2 IMPLANT
TOWEL OR 17X24 6PK STRL BLUE (TOWEL DISPOSABLE) IMPLANT
TOWEL OR 17X26 10 PK STRL BLUE (TOWEL DISPOSABLE) ×2 IMPLANT

## 2015-04-29 NOTE — Anesthesia Preprocedure Evaluation (Addendum)
Anesthesia Evaluation  Patient identified by MRN, date of birth, ID band Patient awake    Reviewed: Allergy & Precautions, NPO status , Patient's Chart, lab work & pertinent test results  History of Anesthesia Complications Negative for: history of anesthetic complications  Airway Mallampati: I  TM Distance: >3 FB Neck ROM: Full    Dental no notable dental hx. (+) Dental Advisory Given   Pulmonary sleep apnea (pt does not use CPAP) , former smoker,    Pulmonary exam normal breath sounds clear to auscultation       Cardiovascular negative cardio ROS Normal cardiovascular exam Rhythm:Regular Rate:Normal     Neuro/Psych PSYCHIATRIC DISORDERS Anxiety Depression negative neurological ROS     GI/Hepatic negative GI ROS, Neg liver ROS,   Endo/Other  negative endocrine ROS  Renal/GU negative Renal ROS   Prostate cancer with mets to bone: hormonal treatment negative genitourinary   Musculoskeletal negative musculoskeletal ROS (+)   Abdominal   Peds negative pediatric ROS (+)  Hematology negative hematology ROS (+) Blood dyscrasia (Hb 9.1, plt 86K), ,   Anesthesia Other Findings   Reproductive/Obstetrics negative OB ROS                           Anesthesia Physical Anesthesia Plan  ASA: III  Anesthesia Plan: General   Post-op Pain Management:    Induction: Intravenous  Airway Management Planned: Oral ETT  Additional Equipment:   Intra-op Plan:   Post-operative Plan: Extubation in OR  Informed Consent: I have reviewed the patients History and Physical, chart, labs and discussed the procedure including the risks, benefits and alternatives for the proposed anesthesia with the patient or authorized representative who has indicated his/her understanding and acceptance.   Dental advisory given  Plan Discussed with: CRNA and Surgeon  Anesthesia Plan Comments: (Plan routine monitors,  GETA)       Anesthesia Quick Evaluation

## 2015-04-29 NOTE — Anesthesia Procedure Notes (Signed)
Procedure Name: Intubation Date/Time: 04/29/2015 5:59 PM Performed by: Lissa Morales Pre-anesthesia Checklist: Patient identified, Emergency Drugs available, Suction available and Patient being monitored Patient Re-evaluated:Patient Re-evaluated prior to inductionOxygen Delivery Method: Circle System Utilized Preoxygenation: Pre-oxygenation with 100% oxygen Intubation Type: IV induction Ventilation: Mask ventilation without difficulty Laryngoscope Size: Mac and 4 Grade View: Grade II Tube type: Oral Tube size: 7.5 mm Number of attempts: 1 Airway Equipment and Method: Stylet and Oral airway Placement Confirmation: ETT inserted through vocal cords under direct vision,  positive ETCO2 and breath sounds checked- equal and bilateral Tube secured with: Tape Dental Injury: Teeth and Oropharynx as per pre-operative assessment

## 2015-04-29 NOTE — Progress Notes (Signed)
Spoke with spouse and patient at bedside. They are agreeable to proceed with surgery here then transfer to [facility] for rehab. Awaiting callback from [place].

## 2015-04-29 NOTE — Progress Notes (Signed)
Patient confused. Patient disorientated to place and situation. Bed alarm active. Patient refusing to have EKG done at this time. Wife called and notified.

## 2015-04-29 NOTE — H&P (Signed)
History and Physical:    Devin Saunders   [id number] DOB: Dec 28, 1945 DOA: 04/28/2015  Referring MD/provider: Etta Quill, NP PCP: Pcp Not In [place]  Chief Complaint: Right hip pain and swelling of the right thigh  History of Present Illness:   Devin Saunders is an 70 y.o. male the PMH of prostate cancer metastatic to bone who was admitted from the ED 04/28/15 after sustaining a pathologic fracture of his right hip. He was lying in bed and attempted to answer the phone, rolling over on his right side when he heard a "pop "in developed pain and swelling of the inner thigh afterwards. He was able to ambulate down the stairs with assistance. Movement makes the pain worse, lying still improves the pain. Upon initial evaluation in the ED, a CT scan revealed a displaced fracture of the lesser trochanter along with linear nondisplaced fractures of the base of the femoral neck and base of the greater trochanter. These were felt to be pathologic fractures related to bone metastasis. Orthopedic surgery was subsequently consulted with plans to proceed with operative repair later today. Of note, when speaking with the patient's wife (whom he is separated from), she reports that he has had increased falls at home over the past several weeks.  ROS:   Review of Systems  Constitutional: Negative for fever and chills.  Eyes: Negative.   Respiratory: Negative for cough and shortness of breath.   Cardiovascular: Negative for chest pain and palpitations.  Gastrointestinal: Negative.   Genitourinary: Negative.   Musculoskeletal: Positive for joint pain and falls.  Skin: Negative.   Neurological: Negative.  Negative for headaches.  Endo/Heme/Allergies: Bruises/bleeds easily.  Psychiatric/Behavioral: Negative.     Past Medical History:   Past Medical History  Diagnosis Date  . Prostate cancer metastatic to bone ([place])   . Dementia     versus a "breakdown"  . Erectile dysfunction     . Chronic leg pain     On neurontin  . Depression     Past Surgical History:   Past Surgical History  Procedure Laterality Date  . Right arm orthopedic    . Mandible fracture surgery      Social History:   Social History   Social History  . Marital Status: Married    Spouse Name: N/A  . Number of Children: 1  . Years of Education: N/A   Occupational History  . Not on file.   Social History Main Topics  . Smoking status: Former Research scientist (life sciences)  . Smokeless tobacco: Not on file  . Alcohol Use: No  . Drug Use: No  . Sexual Activity: Not on file   Other Topics Concern  . Not on file   Social History Narrative   Separated from wife, lives alone. Independent of ADLs and ambulation prior to admission.    Family history:   Family History  Problem Relation Age of Onset  . Lung cancer Mother   . Mental illness Brother     Allergies   Review of patient's allergies indicates no known allergies.  Current Medications:   Prior to Admission medications   Medication Sig Start Date End Date Taking? Authorizing Provider  Calcium Carbonate-Vitamin D (CALCIUM 600+D) 600-400 MG-UNIT tablet Take 1 tablet by mouth daily.   Yes Historical Provider, MD  diclofenac (VOLTAREN) 75 MG EC tablet Take 75 mg by mouth 2 (two) times daily.   Yes Historical Provider, MD  donepezil (ARICEPT) 10 MG tablet Take 10 mg by  mouth at bedtime.   Yes Historical Provider, MD  gabapentin (NEURONTIN) 100 MG capsule Take 100-200 mg by mouth at bedtime.   Yes Historical Provider, MD  Glucosamine HCl 1000 MG TABS Take 1 tablet by mouth daily.   Yes Historical Provider, MD  HYDROcodone-acetaminophen (NORCO/VICODIN) 5-325 MG tablet Take 1 tablet by mouth 2 (two) times daily as needed for moderate pain.   Yes Historical Provider, MD  ibuprofen (ADVIL,MOTRIN) 200 MG tablet Take 200 mg by mouth every 6 (six) hours as needed for headache or moderate pain.   Yes Historical Provider, MD  sertraline (ZOLOFT) 100 MG  tablet Take 100 mg by mouth daily.   Yes Historical Provider, MD  sildenafil (VIAGRA) 100 MG tablet Take 100 mg by mouth daily as needed for erectile dysfunction.   Yes Historical Provider, MD    Physical Exam:   Filed Vitals:   04/29/15 0143 04/29/15 0348 04/29/15 0514 04/29/15 0615  BP: 122/63 124/70 119/68 119/63  Pulse: 73 72 73 72  Temp:   98.3 F (36.8 C) 98.6 F (37 C)  TempSrc:   Oral Oral  Resp: 18 17 17 16   Height:    5\' 10"  (1.778 m)  Weight:    74.844 kg (165 lb)  SpO2: 96% 95% 95% 94%     Physical Exam: Blood pressure 119/63, pulse 72, temperature 98.6 F (37 C), temperature source Oral, resp. rate 16, height 5\' 10"  (1.778 m), weight 74.844 kg (165 lb), SpO2 94 %. Gen: No acute distress. Head: Normocephalic, atraumatic. Eyes: PERRL, EOMI, sclerae nonicteric. Mouth: OropharynxClear with slightly dry mucous membranes. Neck: Supple, no thyromegaly, no lymphadenopathy, no jugular venous distention. Chest: Lungs are clear to auscultation bilaterally. CV: Heart sounds are regular. No murmurs, rubs, or gallops. Abdomen: Soft, nontender, nondistended with normal active bowel sounds. Extremities: Extremities are without clubbing, edema, or cyanosis. Skin: Warm and dry. Large ecchymosis to the left chest. Neuro: Alert and oriented times 2; grossly nonfocal. Psych: Mood and affect normal.   Data Review:    Labs: Basic Metabolic Panel:  Recent Labs Lab 04/29/15 0400  NA 136  K 4.4  CL 104  CO2 22  GLUCOSE 121*  BUN 19  CREATININE 0.87  CALCIUM 7.9*   Liver Function Tests:  Recent Labs Lab 04/29/15 0400  AST 45*  ALT 18  ALKPHOS 177*  BILITOT 0.5  PROT 6.3*  ALBUMIN 3.5   CBC:  Recent Labs Lab 04/29/15 0400  WBC 5.6  NEUTROABS 4.6  HGB 9.1*  HCT 27.8*  MCV 92.1  PLT 86*    Radiographic Studies: Ct Hip Right Wo Contrast  04/29/2015  CLINICAL DATA:  Right hip pain. Lesser trochanter fracture on plain films. EXAM: CT OF THE RIGHT HIP  WITHOUT CONTRAST TECHNIQUE: Multidetector CT imaging of the right hip was performed according to the standard protocol. Multiplanar CT image reconstructions were also generated. COMPARISON:  Right femur 04/28/2015 FINDINGS: Again demonstrated is a fracture of the lesser trochanter with superior and medial displacement of the lesser trochanteric fragment. In addition, there is a linear lucency through the base of the femoral neck consistent with a nondisplaced fracture. Linear lucency also extends obliquely across the base of the greater trochanter. No evidence of dislocation. On at 50 diffuse abnormal bone density is demonstrated with a heterogeneous sclerosis and lucency with irregular margination and irregular cortex demonstrated throughout the visualized right pelvis and with heterogeneous lucency and sclerosis in the femoral head and neck. This is consistent with underlying infiltrative  bone process. Suspect metastatic involvement although myeloma or lymphoma could also possibly have this appearance. Enlarged lymph nodes are demonstrated in the right iliac chains, measuring up to 23 mm short axis dimension. Musculature medial to the proximal femur is somewhat prominent with hazy margins, suggesting intramuscular hematoma. IMPRESSION: Displaced fracture of the lesser trochanter. Additional linear nondisplaced fractures of the base of the femoral neck and of the base of the greater trochanter. There is underlying abnormal bone mineralization consistent with pathologic fractures. Bone sclerosis and lucency with cortical irregularity may indicate metastasis, myeloma, or other infiltrating bone process. Enlarged lymph nodes are present in the right iliac chains suggesting metastatic lymphadenopathy. Electronically Signed   By: Lucienne Capers M.D.   On: 04/29/2015 02:36   Dg Knee Complete 4 Views Right  04/28/2015  CLINICAL DATA:  70 year old male with right knee pain after feeling a pop in the right leg. EXAM:  RIGHT KNEE - COMPLETE 4+ VIEW COMPARISON:  None. FINDINGS: There is no acute fracture or dislocation. There is mild osteopenia. No significant arthritic changes. There is no joint effusion. The soft tissues appear unremarkable. IMPRESSION: No acute fracture or dislocation. Electronically Signed   By: Anner Crete M.D.   On: 04/28/2015 23:13   Dg Femur, Min 2 Views Right  04/28/2015  CLINICAL DATA:  Right femoral pain and tightness after making a sudden movement while lying down. EXAM: RIGHT FEMUR 2 VIEWS COMPARISON:  None. FINDINGS: There is a fracture of the lesser trochanter of the right hip with superior displacement of the fracture fragment. No evidence of femoral neck, shaft, or distal femoral fractures. Heterogeneous sclerotic appearance of the right superior and inferior pubic rami suggesting underlying bone disease. This could represent Paget's disease or metastasis. This is possibly a pathologic fracture. IMPRESSION: Displaced fracture of the lesser trochanter of the right femur, possibly pathologic. Abnormal bone sclerosis in the superior and inferior pubic rami suggesting Paget's disease or infiltrating bone disease such as metastasis. Electronically Signed   By: Lucienne Capers M.D.   On: 04/28/2015 23:13    EKG: Ordered and is still pending at the time of this dictation.   Assessment/Plan:   Principal Problem:   Closed Pathologic right hip fracture (HCC)In the setting of metastatic prostate cancer - ASA pre-op protocal ordered per hip fracture order set. - Activity: Bedrest with HOB elevated 30 degrees. - Hip films show: A displaced fracture of the lesser trochanter of the right femur as well as nondisplaced fractures of the base of the greater trochanter and femoral neck - Orthopedic surgery consulted with operative repair scheduled for this afternoon. - CXR pending. - EKG pending. - Pre-op clearance performed. Proceed with surgery. - Obtain ST consult for swallowing  evaluation (if indicated by criteria on order set). - SW consult for SNF placement.  - PT evaluation. - Vicodin / Morphine ordered for pain control. - Robaxin PRN for muscle relaxation. - Bowel regimen initiated.  Active Problems:   Prostate cancer metastatic to bone [facility]) - We need to consider palliative care consultation, but care is delivered at the [facility].    Dementia - Continue Aricept.    Chronic leg pain - Continue Neurontin.    Depression - Continue Zoloft.    DVT prophylaxis - Lovenox ordered starting 04/30/15.  Code Status / Family Communication / Disposition Plan:   Code Status: Full. Family Communication: Wife by telephone. Note: They are separated and he does not live with her. He lives alone. Disposition Plan: Will need  SNF for rehabilitation.   Time spent: One hour.  Shalyn Koral [facility] Pager [phone] Cell: [phone]   If 7PM-7AM, please contact night-coverage [email or web address] Password [facility] 04/29/2015, 11:16 AM

## 2015-04-29 NOTE — Anesthesia Postprocedure Evaluation (Signed)
Anesthesia Post Note  Patient: Devin Saunders  Procedure(s) Performed: Procedure(s) (LRB): INTRAMEDULLARY (IM) NAIL INTERTROCHANTRIC (Right)  Patient location during evaluation: PACU Anesthesia Type: General Level of consciousness: awake and alert, oriented and patient cooperative Pain management: pain level controlled Vital Signs Assessment: post-procedure vital signs reviewed and stable Respiratory status: spontaneous breathing, nonlabored ventilation, respiratory function stable and patient connected to nasal cannula oxygen Cardiovascular status: blood pressure returned to baseline and stable Postop Assessment: no signs of nausea or vomiting Anesthetic complications: no    Last Vitals:  Filed Vitals:   04/29/15 0615 04/29/15 1416  BP: 119/63 120/60  Pulse: 72 70  Temp: 37 C 36.5 C  Resp: 16 15    Last Pain:  Filed Vitals:   04/29/15 1644  PainSc: Asleep                 Gabriel Conry,E. Letisha Yera

## 2015-04-29 NOTE — Transfer of Care (Signed)
Immediate Anesthesia Transfer of Care Note  Patient: Devin Saunders  Procedure(s) Performed: Procedure(s): INTRAMEDULLARY (IM) NAIL INTERTROCHANTRIC (Right)  Patient Location: PACU  Anesthesia Type:General  Level of Consciousness: awake, alert , oriented and patient cooperative  Airway & Oxygen Therapy: Patient Spontanous Breathing and Patient connected to face mask oxygen  Post-op Assessment: Report given to RN, Post -op Vital signs reviewed and stable and Patient moving all extremities X 4  Post vital signs: stable  Last Vitals:  Filed Vitals:   04/29/15 0615 04/29/15 1416  BP: 119/63 120/60  Pulse: 72 70  Temp: 37 C 36.5 C  Resp: 16 15    Complications: No apparent anesthesia complications

## 2015-04-29 NOTE — Progress Notes (Signed)
PHARMACIST - PHYSICIAN ORDER COMMUNICATION  CONCERNING: P&T Medication Policy on Herbal Medications  DESCRIPTION:  This patient's order for:  Glucosamine  has been noted.  This product(s) is classified as an "herbal" or natural product. Due to a lack of definitive safety studies or FDA approval, nonstandard manufacturing practices, plus the potential risk of unknown drug-drug interactions while on inpatient medications, the Pharmacy and Therapeutics Committee does not permit the use of "herbal" or natural products of this type within [facility].   ACTION TAKEN: The pharmacy department is unable to verify this order at this time and your patient has been informed of this safety policy. Please reevaluate patient's clinical condition at discharge and address if the herbal or natural product(s) should be resumed at that time.  Thanks, Garnet Sierras, PharmD 04/29/2015

## 2015-04-29 NOTE — ED Notes (Signed)
Pt's wife, [phone], [name] Pt transferred to [facility] via [employer] 

## 2015-04-29 NOTE — Progress Notes (Signed)
  Transfer from [facility] per PA, Etta Quill  70 year old man with past medical history of prostate cancer, who presents with fall and right hip fracture. Hemodynamically stable. Orthopedic surgeon, Dr. Erlinda Hong was consulted, will see patient in morning. Pt is accepted to Med-surg bed.   Ivor Costa, MD  [facility] Pager [phone]  If 7PM-7AM, please contact night-coverage [email or web address] Password [facility] 04/29/2015, 3:22 AM

## 2015-04-29 NOTE — Discharge Instructions (Signed)
° ° °  1. Change dressings as needed °2. May shower but keep incisions covered and dry °3. Take lovenox to prevent blood clots °4. Take stool softeners as needed °5. Take pain meds as needed ° °

## 2015-04-29 NOTE — Progress Notes (Signed)
Contacted by nurse that patient and spouse are requesting transfer to [place] if possible, contacted [facility] at [phone] [phone] and left message for [facility] that patient requesting transfer. Awaiting call back.

## 2015-04-29 NOTE — Op Note (Signed)
   Date of Surgery: 04/29/2015  INDICATIONS: Devin Saunders is a 70 y.o.-year-old male who sustained a right pathologic hip fracture. The risks and benefits of the procedure discussed with the patient prior to the procedure and all questions were answered; consent was obtained.  PREOPERATIVE DIAGNOSIS: right pathologic intertrochanteric hip fracture   POSTOPERATIVE DIAGNOSIS: Same   PROCEDURE: Treatment of intertrochanteric fracture with intramedullary implant. CPT 347 [id number]   SURGEON: N. Eduard Roux, M.D.   ANESTHESIA: general   IV FLUIDS AND URINE: See anesthesia record   ESTIMATED BLOOD LOSS: 100 cc  IMPLANTS: [employer] InterTAN 10 x 38, 85/80  DRAINS: None.   COMPLICATIONS: None.   DESCRIPTION OF PROCEDURE: The patient was brought to the operating room and placed supine on the operating table. The patient's leg had been signed prior to the procedure. The patient had the anesthesia placed by the anesthesiologist. The prep verification and incision time-outs were performed to confirm that this was the correct patient, site, side and location. The patient had an SCD on the opposite lower extremity. The patient did receive antibiotics prior to the incision and was re-dosed during the procedure as needed at indicated intervals. The patient was positioned on the fracture table with the table in traction and internal rotation to reduce the hip. The well leg was placed in a scissor position and all bony prominences were well-padded. The patient had the lower extremity prepped and draped in the standard surgical fashion. The incision was made 4 finger breadths superior to the greater trochanter. A guide pin was inserted into the tip of the greater trochanter under fluoroscopic guidance. An opening reamer was used to gain access to the femoral canal. The nail length was measured and inserted down the femoral canal to its proper depth. The appropriate version of insertion for the lag screw was found  under fluoroscopy. A pin was inserted up the femoral neck through the jig. Then, a second antirotation pin was inserted inferior to the first pin. The length of the lag screw was then measured. The lag screw was inserted as near to center-center in the head as possible. The antirotation pin was then taken out and an interdigitating compression screw was placed in its place. The leg was taken out of traction, then the interdigitating compression screw was used to compress across the fracture. Compression was visualized on serial xrays. The wound was copiously irrigated with saline and the subcutaneous layer closed with 2.0 vicryl and the skin was reapproximated with staples. The wounds were cleaned and dried a final time and a sterile dressing was placed. The hip was taken through a range of motion at the end of the case under fluoroscopic imaging to visualize the approach-withdraw phenomenon and confirm implant length in the head. The patient was then awakened from anesthesia and taken to the recovery room in stable condition. All counts were correct at the end of the case.   POSTOPERATIVE PLAN: The patient will be weight bearing as tolerated and will return in 2 weeks for staple removal and the patient will receive DVT prophylaxis based on other medications, activity level, and risk ratio of bleeding to thrombosis.   Azucena Cecil, MD [place] 6:56 PM

## 2015-04-29 NOTE — Consult Note (Signed)
ORTHOPAEDIC CONSULTATION  REQUESTING PHYSICIAN: Venetia Maxon Rama, MD  Chief Complaint: Right pathologic hip fracture  HPI: Devin Saunders is a 70 y.o. male who presents with right hip fracture s/p mechanical fall.  The patient endorses severe pain in the right hip, that does not radiate, grinding in quality, worse with any movement, better with immobilization.  Denies LOC/fever/chills/nausea/vomiting.  Walks without assistive devices (walker, cane, wheelchair).  Does live independently.  He has known metastatic prostate cancer to bone.  Endorses baseline pain in right hip prior to injury.  Past Medical History  Diagnosis Date  . Prostate cancer metastatic to bone ([place])   . Dementia     versus a "breakdown"  . Erectile dysfunction   . Chronic leg pain     On neurontin  . Depression    Past Surgical History  Procedure Laterality Date  . Right arm orthopedic    . Mandible fracture surgery     Social History   Social History  . Marital Status: Married    Spouse Name: N/A  . Number of Children: 1  . Years of Education: N/A   Social History Main Topics  . Smoking status: Former Research scientist (life sciences)  . Smokeless tobacco: None  . Alcohol Use: No  . Drug Use: No  . Sexual Activity: Not Asked   Other Topics Concern  . None   Social History Narrative   Separated from wife, lives alone. Independent of ADLs and ambulation prior to admission.   Family History  Problem Relation Age of Onset  . Lung cancer Mother   . Mental illness Brother    No Known Allergies Prior to Admission medications   Medication Sig Start Date End Date Taking? Authorizing Provider  Calcium Carbonate-Vitamin D (CALCIUM 600+D) 600-400 MG-UNIT tablet Take 1 tablet by mouth daily.   Yes Historical Provider, MD  diclofenac (VOLTAREN) 75 MG EC tablet Take 75 mg by mouth 2 (two) times daily.   Yes Historical Provider, MD  donepezil (ARICEPT) 10 MG tablet Take 10 mg by mouth at bedtime.   Yes Historical Provider, MD    gabapentin (NEURONTIN) 100 MG capsule Take 100-200 mg by mouth at bedtime.   Yes Historical Provider, MD  Glucosamine HCl 1000 MG TABS Take 1 tablet by mouth daily.   Yes Historical Provider, MD  HYDROcodone-acetaminophen (NORCO/VICODIN) 5-325 MG tablet Take 1 tablet by mouth 2 (two) times daily as needed for moderate pain.   Yes Historical Provider, MD  ibuprofen (ADVIL,MOTRIN) 200 MG tablet Take 200 mg by mouth every 6 (six) hours as needed for headache or moderate pain.   Yes Historical Provider, MD  sertraline (ZOLOFT) 100 MG tablet Take 100 mg by mouth daily.   Yes Historical Provider, MD  sildenafil (VIAGRA) 100 MG tablet Take 100 mg by mouth daily as needed for erectile dysfunction.   Yes Historical Provider, MD   Ct Hip Right Wo Contrast  04/29/2015  CLINICAL DATA:  Right hip pain. Lesser trochanter fracture on plain films. EXAM: CT OF THE RIGHT HIP WITHOUT CONTRAST TECHNIQUE: Multidetector CT imaging of the right hip was performed according to the standard protocol. Multiplanar CT image reconstructions were also generated. COMPARISON:  Right femur 04/28/2015 FINDINGS: Again demonstrated is a fracture of the lesser trochanter with superior and medial displacement of the lesser trochanteric fragment. In addition, there is a linear lucency through the base of the femoral neck consistent with a nondisplaced fracture. Linear lucency also extends obliquely across the base of the greater trochanter.  No evidence of dislocation. On at 50 diffuse abnormal bone density is demonstrated with a heterogeneous sclerosis and lucency with irregular margination and irregular cortex demonstrated throughout the visualized right pelvis and with heterogeneous lucency and sclerosis in the femoral head and neck. This is consistent with underlying infiltrative bone process. Suspect metastatic involvement although myeloma or lymphoma could also possibly have this appearance. Enlarged lymph nodes are demonstrated in the  right iliac chains, measuring up to 23 mm short axis dimension. Musculature medial to the proximal femur is somewhat prominent with hazy margins, suggesting intramuscular hematoma. IMPRESSION: Displaced fracture of the lesser trochanter. Additional linear nondisplaced fractures of the base of the femoral neck and of the base of the greater trochanter. There is underlying abnormal bone mineralization consistent with pathologic fractures. Bone sclerosis and lucency with cortical irregularity may indicate metastasis, myeloma, or other infiltrating bone process. Enlarged lymph nodes are present in the right iliac chains suggesting metastatic lymphadenopathy. Electronically Signed   By: Lucienne Capers M.D.   On: 04/29/2015 02:36   Dg Knee Complete 4 Views Right  04/28/2015  CLINICAL DATA:  70 year old male with right knee pain after feeling a pop in the right leg. EXAM: RIGHT KNEE - COMPLETE 4+ VIEW COMPARISON:  None. FINDINGS: There is no acute fracture or dislocation. There is mild osteopenia. No significant arthritic changes. There is no joint effusion. The soft tissues appear unremarkable. IMPRESSION: No acute fracture or dislocation. Electronically Signed   By: Anner Crete M.D.   On: 04/28/2015 23:13   Dg Femur, Min 2 Views Right  04/28/2015  CLINICAL DATA:  Right femoral pain and tightness after making a sudden movement while lying down. EXAM: RIGHT FEMUR 2 VIEWS COMPARISON:  None. FINDINGS: There is a fracture of the lesser trochanter of the right hip with superior displacement of the fracture fragment. No evidence of femoral neck, shaft, or distal femoral fractures. Heterogeneous sclerotic appearance of the right superior and inferior pubic rami suggesting underlying bone disease. This could represent Paget's disease or metastasis. This is possibly a pathologic fracture. IMPRESSION: Displaced fracture of the lesser trochanter of the right femur, possibly pathologic. Abnormal bone sclerosis in the  superior and inferior pubic rami suggesting Paget's disease or infiltrating bone disease such as metastasis. Electronically Signed   By: Lucienne Capers M.D.   On: 04/28/2015 23:13    All pertinent xrays, MRI, CT independently reviewed and interpreted  Positive ROS: All other systems have been reviewed and were otherwise negative with the exception of those mentioned in the HPI and as above.  Physical Exam: General: no acute distress Cardiovascular: No pedal edema Respiratory: No cyanosis, no use of accessory musculature GI: No organomegaly, abdomen is soft and non-tender Skin: No lesions in the area of chief complaint Neurologic: Sensation intact distally Psychiatric: Patient is competent for consent with normal mood and affect Lymphatic: No axillary or cervical lymphadenopathy  MUSCULOSKELETAL:  - severe pain with movement of the hip and extremity - skin intact - NVI distally - compartments soft  Assessment: Right pathologic hip fracture  Plan: - surgery is recommended, patient and family are aware of r/b/a and wish to proceed - consent obtained - medical optimization per primary team - surgery is planned for today - Based on history and fracture pattern this likely represents a fragility fracture.   Thank you for the consult and the opportunity to see Mr. Kyus Zerkel. Eduard Roux, MD [place] 11:31 AM

## 2015-04-30 LAB — BASIC METABOLIC PANEL
ANION GAP: 10 (ref 5–15)
BUN: 18 mg/dL (ref 6–20)
CHLORIDE: 108 mmol/L (ref 101–111)
CO2: 21 mmol/L — AB (ref 22–32)
Calcium: 7.2 mg/dL — ABNORMAL LOW (ref 8.9–10.3)
Creatinine, Ser: 1.03 mg/dL (ref 0.61–1.24)
GFR calc non Af Amer: >60 mL/min (ref >60)
GLUCOSE: 138 mg/dL — AB (ref 65–99)
POTASSIUM: 4.5 mmol/L (ref 3.5–5.1)
Sodium: 139 mmol/L (ref 135–145)

## 2015-04-30 LAB — CBC
HEMATOCRIT: 24.2 % — AB (ref 39.0–52.0)
HEMOGLOBIN: 8.5 g/dL — AB (ref 13.0–17.0)
MCH: 30.4 pg (ref 26.0–34.0)
MCHC: 35.1 g/dL (ref 30.0–36.0)
MCV: 86.4 fL (ref 78.0–100.0)
Platelets: 71 10*3/uL — ABNORMAL LOW (ref 150–400)
RBC: 2.8 MIL/uL — AB (ref 4.22–5.81)
RDW: 14.5 % (ref 11.5–15.5)
WBC: 8.8 10*3/uL (ref 4.0–10.5)

## 2015-04-30 NOTE — Clinical Social Work Note (Signed)
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  Yes Need for family participation in patient care:  Yes (Comment)  Care giving concerns:  Referral for snf placement   Social Worker assessment / plan:  CSW met with pt, pt wife, and pt son at bedside. Pt recommended for snf placement. Pt gave permssion to speak with family regarding discharge plans. Patient currently receiving [facility] for [facility] at [facility], and has VA benefits. CSW provided information to charge nurse for billing.   Pt family interested in short term rehab for patient, as their home is not appropriate for his limited mobility at this time, but hopeful for a short term rehab stay to increase strength and return home.   Patient son lives in [place], and shared information for [facility] [phone]. CSW called to inquire about admissions.  Pt and pt family also intersted in [place] [facility] skilled nursing and [facility] skilled nursing.   Employment status:  Retired Forensic scientist:  [employer] PT Recommendations:    Information / Referral to community resources:  [place]  Patient/Family's Response to care: patient and patient family receptive to recommendations for snf placement for short term rehab.   Patient/Family's Understanding of and Emotional Response to Diagnosis, Current Treatment, and Prognosis:  Pt verbalized understanding of prognosis and recommendations for short term rehab.   Emotional Assessment Appearance:  Appears stated age Attitude/Demeanor/Rapport:  Other ( calm and cooperative) Affect (typically observed):  Accepting Orientation:  Oriented to Place, Oriented to  Time, Oriented to Situation, Oriented to Self Alcohol / Substance use:  Not Applicable Psych involvement (Current and /or in the community):  No (Comment)  Discharge Needs  Concerns to be addressed:  Discharge Planning Concerns Readmission within the last 30 days:  No Current discharge risk:  None Barriers to Discharge:  No Barriers Identified   Jerauld Bostwick, [place], LCSW 04/30/2015, 4:25 PM

## 2015-04-30 NOTE — Progress Notes (Signed)
Progress Note   Devin Saunders [id number] DOB: 05/22/1945 DOA: 04/28/2015 PCP: Pcp Not In System   Brief Narrative:   Devin Saunders is an 70 y.o. male PMH of prostate cancer metastatic to bone who was admitted from the ED 04/28/15 after sustaining a pathologic fracture of his right hip. He subsequently underwent operative repair with an intramedullary implant on 04/29/15 by Dr. Erlinda Hong.  Assessment/Plan:   Principal Problem:  Closed Pathologic right hip fracture (HCC)In the setting of metastatic prostate cancer - ASA pre-op protocal ordered per hip fracture order set. - S/P operative repair with an intramedullary implant on 04/29/15 by Dr. Erlinda Hong.  - Activity: WBAT. PT/OT evaluations requested. - SW consult for SNF placement.  - Vicodin / Morphine ordered for pain control. - Robaxin PRN for muscle relaxation. - Bowel regimen initiated.  Active Problems:  Prostate cancer metastatic to bone [facility]) - We need to consider palliative care consultation, but care is delivered at the [facility].   Dementia - Continue Aricept.   Chronic leg pain - Continue Neurontin.   Depression - Continue Zoloft.   DVT prophylaxis - Lovenox ordered starting 04/30/15.   Family Communication/Anticipated D/C date and plan/Code Status   Family Communication: No family currently at the bedside. Disposition Plan/date: SNF in 1-2 days. Code Status: Full code.   IV Access:    Peripheral IV   Procedures and diagnostic studies:   Ct Hip Right Wo Contrast  04/29/2015  CLINICAL DATA:  Right hip pain. Lesser trochanter fracture on plain films. EXAM: CT OF THE RIGHT HIP WITHOUT CONTRAST TECHNIQUE: Multidetector CT imaging of the right hip was performed according to the standard protocol. Multiplanar CT image reconstructions were also generated. COMPARISON:  Right femur 04/28/2015 FINDINGS: Again demonstrated is a fracture of the lesser trochanter with superior and medial displacement of the  lesser trochanteric fragment. In addition, there is a linear lucency through the base of the femoral neck consistent with a nondisplaced fracture. Linear lucency also extends obliquely across the base of the greater trochanter. No evidence of dislocation. On at 50 diffuse abnormal bone density is demonstrated with a heterogeneous sclerosis and lucency with irregular margination and irregular cortex demonstrated throughout the visualized right pelvis and with heterogeneous lucency and sclerosis in the femoral head and neck. This is consistent with underlying infiltrative bone process. Suspect metastatic involvement although myeloma or lymphoma could also possibly have this appearance. Enlarged lymph nodes are demonstrated in the right iliac chains, measuring up to 23 mm short axis dimension. Musculature medial to the proximal femur is somewhat prominent with hazy margins, suggesting intramuscular hematoma. IMPRESSION: Displaced fracture of the lesser trochanter. Additional linear nondisplaced fractures of the base of the femoral neck and of the base of the greater trochanter. There is underlying abnormal bone mineralization consistent with pathologic fractures. Bone sclerosis and lucency with cortical irregularity may indicate metastasis, myeloma, or other infiltrating bone process. Enlarged lymph nodes are present in the right iliac chains suggesting metastatic lymphadenopathy. Electronically Signed   By: Lucienne Capers M.D.   On: 04/29/2015 02:36   Dg Knee Complete 4 Views Right  04/28/2015  CLINICAL DATA:  70 year old male with right knee pain after feeling a pop in the right leg. EXAM: RIGHT KNEE - COMPLETE 4+ VIEW COMPARISON:  None. FINDINGS: There is no acute fracture or dislocation. There is mild osteopenia. No significant arthritic changes. There is no joint effusion. The soft tissues appear unremarkable. IMPRESSION: No acute fracture or dislocation. Electronically Signed  By: Anner Crete M.D.    On: 04/28/2015 23:13   Dg C-arm 1-60 Min-no Report  04/29/2015  CLINICAL DATA: surgery C-ARM 1-60 MINUTES Fluoroscopy was utilized by the requesting physician.  No radiographic interpretation.   Dg Hip Unilat With Pelvis 2-3 Views Right  04/29/2015  CLINICAL DATA:  Intraoperative right IM nail EXAM: DG C-ARM 1-60 MIN-NO REPORT; DG HIP (WITH OR WITHOUT PELVIS) 2-3V RIGHT FLUOROSCOPY TIME:  Radiation Exposure Index (as provided by the fluoroscopic device): 25.7 mGy If the device does not provide the exposure index: Fluoroscopy Time (in minutes and seconds):  77 seconds Number of Acquired Images:  2 COMPARISON:  04/28/2015 FINDINGS: Intraoperative fluoroscopy is obtained for surgical control purposes. 2 spot fluoroscopic images obtained demonstrate internal fixation of the right proximal femur using and intra medullary rod and 2 compression screws. The distal portion of the rod is not included within the field of view. The right hip demonstrates good alignment and position. There is displacement of the lesser trochanteric fragment. Sclerosis in the innominate bone consistent with underlying bone lesion. IMPRESSION: Intraoperative fluoroscopy demonstrating internal fixation of the right hip. Electronically Signed   By: Lucienne Capers M.D.   On: 04/29/2015 19:32   Dg Femur, Min 2 Views Right  04/28/2015  CLINICAL DATA:  Right femoral pain and tightness after making a sudden movement while lying down. EXAM: RIGHT FEMUR 2 VIEWS COMPARISON:  None. FINDINGS: There is a fracture of the lesser trochanter of the right hip with superior displacement of the fracture fragment. No evidence of femoral neck, shaft, or distal femoral fractures. Heterogeneous sclerotic appearance of the right superior and inferior pubic rami suggesting underlying bone disease. This could represent Paget's disease or metastasis. This is possibly a pathologic fracture. IMPRESSION: Displaced fracture of the lesser trochanter of the right femur,  possibly pathologic. Abnormal bone sclerosis in the superior and inferior pubic rami suggesting Paget's disease or infiltrating bone disease such as metastasis. Electronically Signed   By: Lucienne Capers M.D.   On: 04/28/2015 23:13     Medical Consultants:    None.  Anti-Infectives:   Anti-infectives    Start     Dose/Rate Route Frequency Ordered Stop   04/30/15 0000  ceFAZolin (ANCEF) IVPB 2g/100 mL premix     2 g 200 mL/hr over 30 Minutes Intravenous Every 6 hours 04/29/15 2058 04/30/15 1759   04/29/15 1530  ceFAZolin (ANCEF) IVPB 2g/100 mL premix  Status:  Discontinued    Comments:  Anesthesia to give preop   2 g over 30 Minutes Intravenous  Once 04/29/15 1131 04/29/15 2050      Subjective:   Devin Saunders is sitting up in the chair, mildly confused (didn't know he had hip surgery).  No nausea or vomiting.  Reports some right hip soreness.  No dyspnea/cough.  Objective:    Filed Vitals:   04/29/15 2235 04/30/15 0043 04/30/15 0327 04/30/15 0431  BP: 142/70 133/66 116/65 118/56  Pulse: 100 105 88 82  Temp: 98 F (36.7 C)  98.7 F (37.1 C) 98.2 F (36.8 C)  TempSrc: Oral  Oral Oral  Resp: 18  18 16   Height:      Weight:      SpO2: 94% 94%      Intake/Output Summary (Last 24 hours) at 04/30/15 0741 Last data filed at 04/30/15 [id number]  Gross per 24 hour  Intake 3499.58 ml  Output    475 ml  Net 3024.58 ml   [employer]  04/28/15 2205 04/29/15 0615  Weight: 74.844 kg (165 lb) 74.844 kg (165 lb)    Exam: Gen:  NAD Cardiovascular:  RRR, No M/R/G Respiratory:  Lungs CTAB Gastrointestinal:  Abdomen soft, NT/ND, + BS Extremities:  No C/E/C   Data Reviewed:    Labs: Basic Metabolic Panel:  Recent Labs Lab 04/29/15 0400 04/29/15 2205  NA 136  --   K 4.4  --   CL 104  --   CO2 22  --   GLUCOSE 121*  --   BUN 19  --   CREATININE 0.87 0.96  CALCIUM 7.9*  --    GFR Estimated Creatinine Clearance: 72.9 mL/min (by C-G formula based on Cr of  0.96). Liver Function Tests:  Recent Labs Lab 04/29/15 0400  AST 45*  ALT 18  ALKPHOS 177*  BILITOT 0.5  PROT 6.3*  ALBUMIN 3.5   Coagulation profile  Recent Labs Lab 04/29/15 0400  INR 1.10    CBC:  Recent Labs Lab 04/29/15 0400 04/29/15 2205  WBC 5.6 8.2  NEUTROABS 4.6  --   HGB 9.1* 9.9*  HCT 27.8* 28.8*  MCV 92.1 89.4  PLT 86* 77*    Recent Labs Lab 04/29/15 0400 04/29/15 2205  WBC 5.6 8.2   Microbiology No results found for this or any previous visit (from the past 240 hour(s)).   Medications:   . sodium chloride  10 mL/hr Intravenous Once  . calcium-vitamin D  1 tablet Oral Daily  .  ceFAZolin (ANCEF) IV  2 g Intravenous Q6H  . diclofenac  75 mg Oral BID  . donepezil  10 mg Oral QHS  . enoxaparin (LOVENOX) injection  40 mg Subcutaneous Q24H  . fentaNYL      . gabapentin  100-200 mg Oral QHS  . sertraline  100 mg Oral Daily   Continuous Infusions: . sodium chloride Stopped (04/29/15 1930)  . sodium chloride 125 mL/hr at 04/30/15 0050    Time spent: 25 minutes.   LOS: 1 day   Devin Saunders  [facility] Pager [phone]. If unable to reach me by pager, please call my cell phone at [phone].  *Please refer to [email or web address], password [email or web address] to get updated schedule on who will round on this patient, as hospitalists switch teams weekly. If 7PM-7AM, please contact night-coverage at [email or web address], password [email or web address] for any overnight needs.  04/30/2015, 7:41 AM

## 2015-04-30 NOTE — Evaluation (Signed)
Physical Therapy Evaluation Patient Details Name: Devin Saunders MRN: [id number] DOB: 21-Oct-1945 Today's Date: 04/30/2015   History of Present Illness   pathological R hip fx -->s/p IM nail;   PMHx: dementia, prostate CA with bone mets, depression  Clinical Impression  Pt admitted with above diagnosis. Pt currently with functional limitations due to the deficits listed below (see PT Problem List).  Pt will benefit from skilled PT to increase their independence and safety with mobility to allow discharge to the venue listed below.       Follow Up Recommendations SNF    Equipment Recommendations  Rolling walker with 5" wheels    Recommendations for Other Services       Precautions / Restrictions Precautions Precautions: Fall Restrictions Weight Bearing Restrictions: Yes RLE Weight Bearing: Weight bearing as tolerated      Mobility  Bed Mobility Overal bed mobility: Needs Assistance;+ 2 for safety/equipment;+2 for physical assistance Bed Mobility: Supine to Sit     Supine to sit: Min assist;Mod assist     General bed mobility comments: assist for trunk and RLE,incr time  Transfers Overall transfer level: Needs assistance Equipment used: Rolling walker (2 wheeled) Transfers: Sit to/from Stand Sit to Stand: Min assist         General transfer comment: cues for hand placement, safety and technique  Ambulation/Gait Ambulation/Gait assistance: +2 safety/equipment;Min assist Ambulation Distance (Feet): 22 Feet   Gait Pattern/deviations: Step-to pattern;Step-through pattern;Decreased stride length;Trunk flexed;Shuffle;Narrow base of support     General Gait Details: multi-modal cues for safety, RW position, sequence, step length, assist to maneuver RW  Stairs            Wheelchair Mobility    Modified Rankin (Stroke Patients Only)       Balance Overall balance assessment: Needs assistance;History of Falls           Standing balance-Leahy Scale:  Poor                 High Level Balance Comments: pt reports fall while getting out of the shower "a week or so ago"             Pertinent Vitals/Pain Pain Assessment: Faces Faces Pain Scale: Hurts a little bit Pain Location: right hip Pain Descriptors / Indicators: Grimacing Pain Intervention(s): Limited activity within patient's tolerance;Monitored during session;Repositioned    Home Living Family/patient expects to be discharged to:: Private residence Living Arrangements: Alone   Type of Home: House Home Access: Level entry     Home Layout: Two level;Bed/bath upstairs Home Equipment: None Additional Comments: info taken from pt--unsure of accuracy, no family present at time of eval    Prior Function Level of Independence: Independent               Hand Dominance        Extremity/Trunk Assessment   Upper Extremity Assessment: Defer to OT evaluation           Lower Extremity Assessment: RLE deficits/detail RLE Deficits / Details: ankle WFL; grossly 2+/5 at hip and knee       Communication   Communication: No difficulties  Cognition Arousal/Alertness: Awake/alert Behavior During Therapy: WFL for tasks assessed/performed Overall Cognitive Status: History of cognitive impairments - at baseline Area of Impairment: Memory;Following commands;Problem solving     Memory: Decreased short-term memory Following Commands: Follows one step commands with increased time     Problem Solving: Difficulty sequencing;Decreased initiation;Slow processing;Requires verbal cues;Requires tactile cues General Comments: pt does not remember  why his hip is "tender"    General Comments      Exercises        Assessment/Plan    PT Assessment Patient needs continued PT services  PT Diagnosis Difficulty walking;Generalized weakness   PT Problem List Decreased strength;Decreased range of motion;Decreased activity tolerance;Decreased balance;Decreased  mobility;Decreased safety awareness;Decreased knowledge of use of DME  PT Treatment Interventions DME instruction;Gait training;Functional mobility training;Therapeutic activities;Patient/family education;Therapeutic exercise   PT Goals (Current goals can be found in the Care Plan section) Acute Rehab PT Goals Time For Goal Achievement: 05/14/15 Potential to Achieve Goals: Good    Frequency Min 3X/week   Barriers to discharge        Co-evaluation               End of Session Equipment Utilized During Treatment: Gait belt Activity Tolerance: Patient tolerated treatment well Patient left: in chair;with call bell/phone within reach;with chair alarm set           Time: [id number] PT Time Calculation (min) (ACUTE ONLY): 16 min   Charges:   PT Evaluation $PT Eval Low Complexity: 1 Procedure     PT G Codes:        Matelyn Antonelli 05/26/2015, 11:30 AM

## 2015-04-30 NOTE — NC FL2 (Signed)
[place] LEVEL OF CARE SCREENING TOOL     IDENTIFICATION  Patient Name: Devin Saunders Birthdate: 17-Sep-1945 Sex: male Admission Date (Current Location): 04/28/2015  [facility] and [place] Number:  Herbalist and Address:  [facility],  [place] [address] [place]      Provider Number:    Attending Physician Name and Address:  Venetia Maxon Rama, MD  Relative Name and Phone Number:       Current Level of Care: Hospital Recommended Level of Care: [place] Prior Approval Number:    Date Approved/Denied:   PASRR Number:    Discharge Plan: SNF    Current Diagnoses: Patient Active Problem List   Diagnosis Date Noted  . Closed right hip fracture ([place]) 04/29/2015  . Pathologic fracture 04/29/2015  . Prostate cancer metastatic to bone ([place])   . Dementia   . Chronic leg pain   . Depression     Orientation RESPIRATION BLADDER Height & Weight     Self, Time, Situation, Place  Normal Continent Weight: 165 lb (74.844 kg) Height:  5\' 10"  (177.8 cm)  BEHAVIORAL SYMPTOMS/MOOD NEUROLOGICAL BOWEL NUTRITION STATUS      Continent    AMBULATORY STATUS COMMUNICATION OF NEEDS Skin   Extensive Assist Verbally Surgical wounds (right hip)                       Personal Care Assistance Level of Assistance  Bathing, Dressing Bathing Assistance: Limited assistance   Dressing Assistance: Limited assistance     Functional Limitations Info             SPECIAL CARE FACTORS FREQUENCY  PT (By licensed PT), OT (By licensed OT)     PT Frequency: 5d per week              Contractures Contractures Info: Not present    Additional Factors Info                  Current Medications (04/30/2015):  This is the current hospital active medication list Current Facility-Administered Medications  Medication Dose Route Frequency Provider Last Rate Last Dose  . 0.9 %  sodium chloride infusion   Intravenous Continuous  Venetia Maxon Rama, MD 10 mL/hr at 04/30/15 1354    . acetaminophen (TYLENOL) tablet 650 mg  650 mg Oral Q6H PRN Naiping Ephriam Jenkins, MD       Or  . acetaminophen (TYLENOL) suppository 650 mg  650 mg Rectal Q6H PRN Naiping Ephriam Jenkins, MD      . alum & mag hydroxide-simeth (MAALOX/MYLANTA) 200-200-20 MG/5ML suspension 30 mL  30 mL Oral Q4H PRN Naiping Ephriam Jenkins, MD      . calcium-vitamin D (OSCAL WITH D) 500-200 MG-UNIT per tablet 1 tablet  1 tablet Oral Daily Naiping Ephriam Jenkins, MD   1 tablet at 04/30/15 1014  . diclofenac (VOLTAREN) EC tablet 75 mg  75 mg Oral BID Leandrew Koyanagi, MD   75 mg at 04/30/15 1013  . donepezil (ARICEPT) tablet 10 mg  10 mg Oral QHS Leandrew Koyanagi, MD   10 mg at 04/29/15 2205  . enoxaparin (LOVENOX) injection 40 mg  40 mg Subcutaneous Q24H Naiping Ephriam Jenkins, MD   40 mg at 04/30/15 0825  . gabapentin (NEURONTIN) capsule 100-200 mg  100-200 mg Oral QHS Leandrew Koyanagi, MD   100 mg at 04/29/15 2205  . HYDROcodone-acetaminophen (NORCO/VICODIN) 5-325 MG per tablet 1-2 tablet  1-2 tablet Oral Q6H PRN Leandrew Koyanagi, MD   2 tablet at 04/30/15 1239  . ibuprofen (ADVIL,MOTRIN) tablet 200 mg  200 mg Oral Q6H PRN Naiping Ephriam Jenkins, MD      . menthol-cetylpyridinium (CEPACOL) lozenge 3 mg  1 lozenge Oral PRN Naiping Ephriam Jenkins, MD       Or  . phenol (CHLORASEPTIC) mouth spray 1 spray  1 spray Mouth/Throat PRN Naiping Ephriam Jenkins, MD      . methocarbamol (ROBAXIN) tablet 500 mg  500 mg Oral Q6H PRN Naiping Ephriam Jenkins, MD       Or  . methocarbamol (ROBAXIN) 500 mg in dextrose 5 % 50 mL IVPB  500 mg Intravenous Q6H PRN Leandrew Koyanagi, MD   500 mg at 04/29/15 2356  . metoCLOPramide (REGLAN) tablet 5-10 mg  5-10 mg Oral Q8H PRN Naiping Ephriam Jenkins, MD       Or  . metoCLOPramide (REGLAN) injection 5-10 mg  5-10 mg Intravenous Q8H PRN Naiping Ephriam Jenkins, MD      . morphine 2 MG/ML injection 0.5 mg  0.5 mg Intravenous Q2H PRN Leandrew Koyanagi, MD   0.5 mg at 04/29/15 2134  . ondansetron (ZOFRAN) tablet 4 mg  4 mg Oral Q6H PRN Leandrew Koyanagi, MD   4 mg at 04/30/15 1314    Or  . ondansetron (ZOFRAN) injection 4 mg  4 mg Intravenous Q6H PRN Naiping Ephriam Jenkins, MD      . oxyCODONE (Oxy IR/ROXICODONE) immediate release tablet 5-10 mg  5-10 mg Oral Q4H PRN Leandrew Koyanagi, MD   10 mg at 04/30/15 0050  . polyethylene glycol (MIRALAX / GLYCOLAX) packet 17 g  17 g Oral Daily PRN Venetia Maxon Rama, MD      . sertraline (ZOLOFT) tablet 100 mg  100 mg Oral Daily Naiping Ephriam Jenkins, MD   100 mg at 04/30/15 1014     Discharge Medications: Please see discharge summary for a list of discharge medications.  Relevant Imaging Results:  Relevant Lab Results:   Additional Information    Harris Penton A, LCSW

## 2015-04-30 NOTE — NC FL2 (Signed)
[place] LEVEL OF CARE SCREENING TOOL     IDENTIFICATION  Patient Name: Devin Saunders Birthdate: 1945-08-27 Sex: male Admission Date (Current Location): 04/28/2015  [facility] and [place] Number:  Herbalist and Address:  [facility],  [place] [address], [place]      Provider Number:    Attending Physician Name and Address:  Venetia Maxon Rama, MD  Relative Name and Phone Number:       Current Level of Care: Hospital Recommended Level of Care: [place] Prior Approval Number:    Date Approved/Denied:   PASRR Number:    Discharge Plan: SNF    Current Diagnoses: Patient Active Problem List   Diagnosis Date Noted  . Closed right hip fracture ([place]) 04/29/2015  . Pathologic fracture 04/29/2015  . Prostate cancer metastatic to bone ([place])   . Dementia   . Chronic leg pain   . Depression     Orientation RESPIRATION BLADDER Height & Weight     Self, Time, Situation, Place  Normal Continent Weight: 165 lb (74.844 kg) Height:  5\' 10"  (177.8 cm)  BEHAVIORAL SYMPTOMS/MOOD NEUROLOGICAL BOWEL NUTRITION STATUS      Continent    AMBULATORY STATUS COMMUNICATION OF NEEDS Skin   Extensive Assist Verbally Surgical wounds (right hip)                       Personal Care Assistance Level of Assistance  Bathing, Dressing Bathing Assistance: Limited assistance   Dressing Assistance: Limited assistance     Functional Limitations Info             SPECIAL CARE FACTORS FREQUENCY  PT (By licensed PT), OT (By licensed OT)     PT Frequency: 5d per week              Contractures Contractures Info: Not present    Additional Factors Info                  Current Medications (04/30/2015):  This is the current hospital active medication list Current Facility-Administered Medications  Medication Dose Route Frequency Provider Last Rate Last Dose  . 0.9 %  sodium chloride infusion   Intravenous Continuous  Venetia Maxon Rama, MD 10 mL/hr at 04/30/15 1354    . acetaminophen (TYLENOL) tablet 650 mg  650 mg Oral Q6H PRN Naiping Ephriam Jenkins, MD       Or  . acetaminophen (TYLENOL) suppository 650 mg  650 mg Rectal Q6H PRN Naiping Ephriam Jenkins, MD      . alum & mag hydroxide-simeth (MAALOX/MYLANTA) 200-200-20 MG/5ML suspension 30 mL  30 mL Oral Q4H PRN Naiping Ephriam Jenkins, MD      . calcium-vitamin D (OSCAL WITH D) 500-200 MG-UNIT per tablet 1 tablet  1 tablet Oral Daily Naiping Ephriam Jenkins, MD   1 tablet at 04/30/15 1014  . diclofenac (VOLTAREN) EC tablet 75 mg  75 mg Oral BID Leandrew Koyanagi, MD   75 mg at 04/30/15 1013  . donepezil (ARICEPT) tablet 10 mg  10 mg Oral QHS Leandrew Koyanagi, MD   10 mg at 04/29/15 2205  . enoxaparin (LOVENOX) injection 40 mg  40 mg Subcutaneous Q24H Naiping Ephriam Jenkins, MD   40 mg at 04/30/15 0825  . gabapentin (NEURONTIN) capsule 100-200 mg  100-200 mg Oral QHS Leandrew Koyanagi, MD   100 mg at 04/29/15 2205  . HYDROcodone-acetaminophen (NORCO/VICODIN) 5-325 MG per tablet 1-2 tablet  1-2 tablet Oral Q6H PRN Leandrew Koyanagi, MD   2 tablet at 04/30/15 1239  . ibuprofen (ADVIL,MOTRIN) tablet 200 mg  200 mg Oral Q6H PRN Naiping Ephriam Jenkins, MD      . menthol-cetylpyridinium (CEPACOL) lozenge 3 mg  1 lozenge Oral PRN Naiping Ephriam Jenkins, MD       Or  . phenol (CHLORASEPTIC) mouth spray 1 spray  1 spray Mouth/Throat PRN Naiping Ephriam Jenkins, MD      . methocarbamol (ROBAXIN) tablet 500 mg  500 mg Oral Q6H PRN Naiping Ephriam Jenkins, MD       Or  . methocarbamol (ROBAXIN) 500 mg in dextrose 5 % 50 mL IVPB  500 mg Intravenous Q6H PRN Leandrew Koyanagi, MD   500 mg at 04/29/15 2356  . metoCLOPramide (REGLAN) tablet 5-10 mg  5-10 mg Oral Q8H PRN Naiping Ephriam Jenkins, MD       Or  . metoCLOPramide (REGLAN) injection 5-10 mg  5-10 mg Intravenous Q8H PRN Naiping Ephriam Jenkins, MD      . morphine 2 MG/ML injection 0.5 mg  0.5 mg Intravenous Q2H PRN Leandrew Koyanagi, MD   0.5 mg at 04/29/15 2134  . ondansetron (ZOFRAN) tablet 4 mg  4 mg Oral Q6H PRN Leandrew Koyanagi, MD   4 mg at 04/30/15 1314    Or  . ondansetron (ZOFRAN) injection 4 mg  4 mg Intravenous Q6H PRN Naiping Ephriam Jenkins, MD      . oxyCODONE (Oxy IR/ROXICODONE) immediate release tablet 5-10 mg  5-10 mg Oral Q4H PRN Leandrew Koyanagi, MD   10 mg at 04/30/15 0050  . polyethylene glycol (MIRALAX / GLYCOLAX) packet 17 g  17 g Oral Daily PRN Venetia Maxon Rama, MD      . sertraline (ZOLOFT) tablet 100 mg  100 mg Oral Daily Naiping Ephriam Jenkins, MD   100 mg at 04/30/15 1014     Discharge Medications: Please see discharge summary for a list of discharge medications.  Relevant Imaging Results:  Relevant Lab Results:   Additional Information    Iori Gigante A, LCSW

## 2015-04-30 NOTE — Progress Notes (Signed)
   Subjective:  Patient reports pain as mild.    Objective:   VITALS:   Filed Vitals:   04/29/15 2235 04/30/15 0043 04/30/15 0327 04/30/15 0431  BP: 142/70 133/66 116/65 118/56  Pulse: 100 105 88 82  Temp: 98 F (36.7 C)  98.7 F (37.1 C) 98.2 F (36.8 C)  TempSrc: Oral  Oral Oral  Resp: 18  18 16   Height:      Weight:      SpO2: 94% 94%      Neurologically intact Neurovascular intact Sensation intact distally Intact pulses distally Dorsiflexion/Plantar flexion intact Incision: dressing C/D/I and scant drainage No cellulitis present Compartment soft   Lab Results  Component Value Date   WBC 8.8 04/30/2015   HGB 8.5* 04/30/2015   HCT 24.2* 04/30/2015   MCV 86.4 04/30/2015   PLT 71* 04/30/2015     Assessment/Plan:  1 Day Post-Op   - Expected postop acute blood loss anemia - will monitor for symptoms - Up with PT/OT - DVT ppx - SCDs, ambulation, lovenox - WBAT operative extremity - platelets 71 - will monitor  Marianna Payment 04/30/2015, 9:34 AM [phone]

## 2015-04-30 NOTE — Evaluation (Signed)
Occupational Therapy Evaluation Patient Details Name: Devin Saunders MRN: [id number] DOB: 1945-02-11 Today's Date: 04/30/2015    History of Present Illness  pathological R hip fx -->s/p IM nail;   PMHx: dementia, prostate CA with bone mets, depression   Clinical Impression   This 70 year old man was admitted for the above. No family available at time of evaluation, so unsure about PLOF.  Pt was able to follow commands with extra time and cueing for safety.  He will benefit from continued OT to increase safety and independence with adls.  Goals in acute are for min guard to min A.    Follow Up Recommendations  SNF    Equipment Recommendations  3 in 1 bedside comode    Recommendations for Other Services       Precautions / Restrictions Precautions Precautions: Fall Restrictions Weight Bearing Restrictions: No RLE Weight Bearing: Weight bearing as tolerated      Mobility Bed Mobility Overal bed mobility: Needs Assistance;+ 2 for safety/equipment;+2 for physical assistance Bed Mobility: Supine to Sit     Supine to sit: Min assist;Mod assist     General bed mobility comments: oob  Transfers Overall transfer level: Needs assistance Equipment used: Rolling walker (2 wheeled) Transfers: Sit to/from Stand Sit to Stand: Min assist         General transfer comment: cues for hand placement, safety and technique    Balance Overall balance assessment: Needs assistance;History of Falls           Standing balance-Leahy Scale: Poor                 High Level Balance Comments: pt reports fall while getting out of the shower "a week or so ago"            ADL Overall ADL's : Needs assistance/impaired     Grooming: Set up;Sitting   Upper Body Bathing: Min guard;Sitting   Lower Body Bathing: Moderate assistance;Sit to/from stand   Upper Body Dressing : Minimal assistance;Sitting   Lower Body Dressing: Maximal assistance;Sit to/from stand                  General ADL Comments: pt states he had difficulty with pants and socks prior to admission.  Educated on AE and tried sock aide with mod A.  Min guard to min A needed to guard lines. When reaching for sock aide, pt reached to R of it. Will further assess vision     Vision Additional Comments: to be further assessed   Perception     Praxis      Pertinent Vitals/Pain Pain Assessment: Faces Faces Pain Scale: Hurts a little bit Pain Location: R hip Pain Descriptors / Indicators: Sore Pain Intervention(s): Limited activity within patient's tolerance;Monitored during session;Repositioned;Ice applied     Hand Dominance     Extremity/Trunk Assessment Upper Extremity Assessment Upper Extremity Assessment: Generalized weakness (grossly 4-/5 bil)          Communication Communication Communication: No difficulties   Cognition Arousal/Alertness: Awake/alert Behavior During Therapy: WFL for tasks assessed/performed Overall Cognitive Status: History of cognitive impairments - at baseline Area of Impairment: Memory;Following commands;Problem solving     Memory: Decreased short-term memory Following Commands: Follows one step commands with increased time     Problem Solving: Difficulty sequencing;Decreased initiation;Slow processing;Requires verbal cues;Requires tactile cues General Comments: stated one of his toilet flushes golf balls; extra time and multimodal cues for new learning (AE)   General Comments  Exercises       Shoulder Instructions      Home Living Family/patient expects to be discharged to:: Unsure Living Arrangements: Alone   Type of Home: House Home Access: Level entry     Home Layout: Two level;Bed/bath upstairs               Home Equipment: None   Additional Comments: Pt states he lives alone.  Separated from wife, but she occasionally shops for him.  Pt has tub shower and walk in and 2 toilets, one of which is higher       Prior Functioning/Environment Level of Independence: Independent             OT Diagnosis: Generalized weakness   OT Problem List: Decreased strength;Decreased activity tolerance;Impaired balance (sitting and/or standing);Decreased cognition;Decreased knowledge of use of DME or AE;Pain   OT Treatment/Interventions: Self-care/ADL training;DME and/or AE instruction;Patient/family education;Balance training;Cognitive remediation/compensation    OT Goals(Current goals can be found in the care plan section) Acute Rehab OT Goals Patient Stated Goal: none stated; agreeable to OT OT Goal Formulation: With patient Time For Goal Achievement: 05/07/15 Potential to Achieve Goals: Good ADL Goals Pt Will Perform Grooming: with min guard assist;standing Pt Will Perform Lower Body Bathing: with min assist;with adaptive equipment;sit to/from stand Pt Will Transfer to Toilet: with min guard assist;ambulating;bedside commode Pt Will Perform Toileting - Clothing Manipulation and hygiene: with min guard assist;sit to/from stand  OT Frequency: Min 2X/week   Barriers to D/C:            Co-evaluation              End of Session Nurse Communication:  (plug alarm back when finished)  Activity Tolerance: Patient tolerated treatment well Patient left: in chair;with call bell/phone within reach;with nursing/sitter in room   Time: 1100-1116 OT Time Calculation (min): 16 min Charges:  OT General Charges $OT Visit: 1 Procedure OT Evaluation $OT Eval Low Complexity: 1 Procedure G-Codes:    Odean Mcelwain 2015/05/12, 11:44 AM  Lesle Chris, OTR/L [phone] 2015/05/12

## 2015-05-01 DIAGNOSIS — M8448XD Pathological fracture, other site, subsequent encounter for fracture with routine healing: Secondary | ICD-10-CM

## 2015-05-01 DIAGNOSIS — D62 Acute posthemorrhagic anemia: Secondary | ICD-10-CM | POA: Diagnosis not present

## 2015-05-01 DIAGNOSIS — M79609 Pain in unspecified limb: Secondary | ICD-10-CM

## 2015-05-01 DIAGNOSIS — S72009D Fracture of unspecified part of neck of unspecified femur, subsequent encounter for closed fracture with routine healing: Secondary | ICD-10-CM

## 2015-05-01 LAB — BASIC METABOLIC PANEL
ANION GAP: 9 (ref 5–15)
BUN: 27 mg/dL — AB (ref 6–20)
CHLORIDE: 105 mmol/L (ref 101–111)
CO2: 22 mmol/L (ref 22–32)
Calcium: 7.4 mg/dL — ABNORMAL LOW (ref 8.9–10.3)
Creatinine, Ser: 1.61 mg/dL — ABNORMAL HIGH (ref 0.61–1.24)
GFR calc Af Amer: 48 mL/min — ABNORMAL LOW (ref >60)
GFR, EST NON AFRICAN AMERICAN: 41 mL/min — AB (ref >60)
GLUCOSE: 124 mg/dL — AB (ref 65–99)
POTASSIUM: 4 mmol/L (ref 3.5–5.1)
Sodium: 136 mmol/L (ref 135–145)

## 2015-05-01 LAB — CBC
HCT: 19 % — ABNORMAL LOW (ref 39.0–52.0)
HEMATOCRIT: 20.5 % — AB (ref 39.0–52.0)
HEMOGLOBIN: 6.7 g/dL — AB (ref 13.0–17.0)
HEMOGLOBIN: 7.3 g/dL — AB (ref 13.0–17.0)
MCH: 31 pg (ref 26.0–34.0)
MCH: 30.4 pg (ref 26.0–34.0)
MCHC: 35.3 g/dL (ref 30.0–36.0)
MCHC: 35.6 g/dL (ref 30.0–36.0)
MCV: 88 fL (ref 78.0–100.0)
MCV: 85.4 fL (ref 78.0–100.0)
PLATELETS: 79 10*3/uL — AB (ref 150–400)
Platelets: 77 10*3/uL — ABNORMAL LOW (ref 150–400)
RBC: 2.16 MIL/uL — AB (ref 4.22–5.81)
RBC: 2.4 MIL/uL — AB (ref 4.22–5.81)
RDW: 14.7 % (ref 11.5–15.5)
RDW: 14.5 % (ref 11.5–15.5)
WBC: 6.8 10*3/uL (ref 4.0–10.5)
WBC: 6.8 10*3/uL (ref 4.0–10.5)

## 2015-05-01 LAB — PREPARE RBC (CROSSMATCH)

## 2015-05-01 MED ORDER — SODIUM CHLORIDE 0.9 % IV SOLN
Freq: Once | INTRAVENOUS | Status: AC
  Administered 2015-05-01: 10 mL/h via INTRAVENOUS

## 2015-05-01 NOTE — Progress Notes (Signed)
Hgb dropped from 8.5 to 6.7.   Will transfuse 1 unit and d/c lovenox.  Imogene Burn, PA-C [facility] Pager: [phone]

## 2015-05-01 NOTE — Progress Notes (Signed)
CRITICAL VALUE ALERT  Critical value received:  Hgb 6.7  Date of notification:  05/01/15  Time of notification:  0425  Critical value read back:yes  Nurse who received alert:  Dennie Bible RN  MD notified (1st page):  [place] NP [facility]  Time of first page:  0430  MD notified (2nd page):  Time of second page:  Responding MD: [place] NP [facility]  Time MD responded:  [phone]

## 2015-05-01 NOTE — Progress Notes (Signed)
   Subjective:  Patient is more alert today.  Objective:   VITALS:   Filed Vitals:   04/30/15 2104 05/01/15 0458 05/01/15 0535 05/01/15 0821  BP: 107/54 102/54 102/55 110/61  Pulse: 79 90 79 71  Temp: 99 F (37.2 C) 98.7 F (37.1 C) 98.8 F (37.1 C) 98.4 F (36.9 C)  TempSrc: Oral Oral Oral Oral  Resp: 20 18 16 16   Height:      Weight:      SpO2: 94% 93% 92% 96%    Neurologically intact Neurovascular intact Sensation intact distally Intact pulses distally Dorsiflexion/Plantar flexion intact Incision: dressing C/D/I and scant drainage No cellulitis present Compartment soft   Lab Results  Component Value Date   WBC 6.8 05/01/2015   HGB 6.7* 05/01/2015   HCT 19.0* 05/01/2015   MCV 88.0 05/01/2015   PLT 79* 05/01/2015     Assessment/Plan:  2 Days Post-Op   - platelets stable - Hgb 6.7 - 1 unit transfused already, may need another unit - SNF pending - stable from ortho stand point - consider aspirin 325 BID for DVT ppx once Hgb has stabilized  Marianna Payment 05/01/2015, 8:57 AM [phone]

## 2015-05-01 NOTE — Progress Notes (Signed)
PT Cancellation Note  Patient Details Name: Devin Saunders MRN: [id number] DOB: [birth year]   Cancelled Treatment:    Reason Eval/Treat Not Completed: Medical issues which prohibited therapy Hgb 6.7 after 1 unit transfused, will hold today and see tomorrow   [facility] 05/01/2015, 9:12 AM

## 2015-05-01 NOTE — Progress Notes (Signed)
Progress Note   Devin Saunders [id number] DOB: Jul 15, 1945 DOA: 04/28/2015 PCP: Pcp Not In System   Brief Narrative:   Devin Saunders is an 70 y.o. male PMH of prostate cancer metastatic to bone who was admitted from the ED 04/28/15 after sustaining a pathologic fracture of his right hip. He subsequently underwent operative repair with an intramedullary implant on 04/29/15 by Dr. Erlinda Hong.  Assessment/Plan:   Principal Problem:  Closed Pathologic right hip fracture (HCC)In the setting of metastatic prostate cancer - ASA pre-op protocal ordered per hip fracture order set. - S/P operative repair with an intramedullary implant on 04/29/15 by Dr. Erlinda Hong.  - Activity: WBAT. PT/OT evaluations requested. - SW consult for SNF placement.  - Vicodin / Morphine ordered for pain control. - Robaxin PRN for muscle relaxation. - Bowel regimen initiated.  Active Problems:   Thrombocytopenia - Discontinue Lovenox.    Acute kidney injury - Likely prerenal. Increase IV fluids.    Acute/postoperative blood loss anemia - We'll give 1 unit of PRBCs.   Prostate cancer metastatic to bone [facility]) - We need to consider palliative care consultation, but care is delivered at the [facility].   Dementia - Continue Aricept.   Chronic leg pain - Continue Neurontin.   Depression - Continue Zoloft.   DVT prophylaxis - Lovenox discontinued secondary to acute blood loss anemia and thrombocytopenia.   Family Communication/Anticipated D/C date and plan/Code Status   Family Communication: No family currently at the bedside. Disposition Plan/date: SNF in 1-2 days. Code Status: Full code.   IV Access:    Peripheral IV   Procedures and diagnostic studies:   Ct Hip Right Wo Contrast  04/29/2015  CLINICAL DATA:  Right hip pain. Lesser trochanter fracture on plain films. EXAM: CT OF THE RIGHT HIP WITHOUT CONTRAST TECHNIQUE: Multidetector CT imaging of the right hip was performed according to  the standard protocol. Multiplanar CT image reconstructions were also generated. COMPARISON:  Right femur 04/28/2015 FINDINGS: Again demonstrated is a fracture of the lesser trochanter with superior and medial displacement of the lesser trochanteric fragment. In addition, there is a linear lucency through the base of the femoral neck consistent with a nondisplaced fracture. Linear lucency also extends obliquely across the base of the greater trochanter. No evidence of dislocation. On at 50 diffuse abnormal bone density is demonstrated with a heterogeneous sclerosis and lucency with irregular margination and irregular cortex demonstrated throughout the visualized right pelvis and with heterogeneous lucency and sclerosis in the femoral head and neck. This is consistent with underlying infiltrative bone process. Suspect metastatic involvement although myeloma or lymphoma could also possibly have this appearance. Enlarged lymph nodes are demonstrated in the right iliac chains, measuring up to 23 mm short axis dimension. Musculature medial to the proximal femur is somewhat prominent with hazy margins, suggesting intramuscular hematoma. IMPRESSION: Displaced fracture of the lesser trochanter. Additional linear nondisplaced fractures of the base of the femoral neck and of the base of the greater trochanter. There is underlying abnormal bone mineralization consistent with pathologic fractures. Bone sclerosis and lucency with cortical irregularity may indicate metastasis, myeloma, or other infiltrating bone process. Enlarged lymph nodes are present in the right iliac chains suggesting metastatic lymphadenopathy. Electronically Signed   By: Lucienne Capers M.D.   On: 04/29/2015 02:36   Dg Knee Complete 4 Views Right  04/28/2015  CLINICAL DATA:  70 year old male with right knee pain after feeling a pop in the right leg. EXAM: RIGHT KNEE - COMPLETE 4+  VIEW COMPARISON:  None. FINDINGS: There is no acute fracture or  dislocation. There is mild osteopenia. No significant arthritic changes. There is no joint effusion. The soft tissues appear unremarkable. IMPRESSION: No acute fracture or dislocation. Electronically Signed   By: Anner Crete M.D.   On: 04/28/2015 23:13   Dg C-arm 1-60 Min-no Report  04/29/2015  CLINICAL DATA: surgery C-ARM 1-60 MINUTES Fluoroscopy was utilized by the requesting physician.  No radiographic interpretation.   Dg Hip Unilat With Pelvis 2-3 Views Right  04/29/2015  CLINICAL DATA:  Intraoperative right IM nail EXAM: DG C-ARM 1-60 MIN-NO REPORT; DG HIP (WITH OR WITHOUT PELVIS) 2-3V RIGHT FLUOROSCOPY TIME:  Radiation Exposure Index (as provided by the fluoroscopic device): 25.7 mGy If the device does not provide the exposure index: Fluoroscopy Time (in minutes and seconds):  77 seconds Number of Acquired Images:  2 COMPARISON:  04/28/2015 FINDINGS: Intraoperative fluoroscopy is obtained for surgical control purposes. 2 spot fluoroscopic images obtained demonstrate internal fixation of the right proximal femur using and intra medullary rod and 2 compression screws. The distal portion of the rod is not included within the field of view. The right hip demonstrates good alignment and position. There is displacement of the lesser trochanteric fragment. Sclerosis in the innominate bone consistent with underlying bone lesion. IMPRESSION: Intraoperative fluoroscopy demonstrating internal fixation of the right hip. Electronically Signed   By: Lucienne Capers M.D.   On: 04/29/2015 19:32   Dg Femur, Min 2 Views Right  04/28/2015  CLINICAL DATA:  Right femoral pain and tightness after making a sudden movement while lying down. EXAM: RIGHT FEMUR 2 VIEWS COMPARISON:  None. FINDINGS: There is a fracture of the lesser trochanter of the right hip with superior displacement of the fracture fragment. No evidence of femoral neck, shaft, or distal femoral fractures. Heterogeneous sclerotic appearance of the right  superior and inferior pubic rami suggesting underlying bone disease. This could represent Paget's disease or metastasis. This is possibly a pathologic fracture. IMPRESSION: Displaced fracture of the lesser trochanter of the right femur, possibly pathologic. Abnormal bone sclerosis in the superior and inferior pubic rami suggesting Paget's disease or infiltrating bone disease such as metastasis. Electronically Signed   By: Lucienne Capers M.D.   On: 04/28/2015 23:13     Medical Consultants:    None.  Anti-Infectives:   Anti-infectives    Start     Dose/Rate Route Frequency Ordered Stop   04/30/15 0000  ceFAZolin (ANCEF) IVPB 2g/100 mL premix     2 g 200 mL/hr over 30 Minutes Intravenous Every 6 hours 04/29/15 2058 04/30/15 1310   04/29/15 1530  ceFAZolin (ANCEF) IVPB 2g/100 mL premix  Status:  Discontinued    Comments:  Anesthesia to give preop   2 g over 30 Minutes Intravenous  Once 04/29/15 1131 04/29/15 2050      Subjective:   Devin Saunders denies chest pain, shortness of breath, nausea. Appetite is okay. No BM today, but does not feel constipated. Continues to have a sore right hip.  Objective:    Filed Vitals:   04/30/15 1420 04/30/15 2104 05/01/15 0458 05/01/15 0535  BP: 100/55 107/54 102/54 102/55  Pulse: 82 79 90 79  Temp: 97.6 F (36.4 C) 99 F (37.2 C) 98.7 F (37.1 C) 98.8 F (37.1 C)  TempSrc: Oral Oral Oral Oral  Resp: 18 20 18 16   Height:      Weight:      SpO2: 95% 94% 93% 92%  Intake/Output Summary (Last 24 hours) at 05/01/15 0750 Last data filed at 05/01/15 0530  Gross per 24 hour  Intake   1080 ml  Output    500 ml  Net    580 ml   Filed Weights   04/28/15 2205 04/29/15 0615  Weight: 74.844 kg (165 lb) 74.844 kg (165 lb)    Exam: Gen:  NAD Cardiovascular:  RRR, No M/R/G Respiratory:  Lungs CTAB Gastrointestinal:  Abdomen soft, NT/ND, + BS Extremities:  No C/E/C   Data Reviewed:    Labs: Basic Metabolic Panel:  Recent  Labs Lab 04/29/15 0400 04/29/15 2205 04/30/15 0754 05/01/15 0343  NA 136  --  139 136  K 4.4  --  4.5 4.0  CL 104  --  108 105  CO2 22  --  21* 22  GLUCOSE 121*  --  138* 124*  BUN 19  --  18 27*  CREATININE 0.87 0.96 1.03 1.61*  CALCIUM 7.9*  --  7.2* 7.4*   GFR Estimated Creatinine Clearance: 43.5 mL/min (by C-G formula based on Cr of 1.61). Liver Function Tests:  Recent Labs Lab 04/29/15 0400  AST 45*  ALT 18  ALKPHOS 177*  BILITOT 0.5  PROT 6.3*  ALBUMIN 3.5   Coagulation profile  Recent Labs Lab 04/29/15 0400  INR 1.10    CBC:  Recent Labs Lab 04/29/15 0400 04/29/15 2205 04/30/15 0754 05/01/15 0343  WBC 5.6 8.2 8.8 6.8  NEUTROABS 4.6  --   --   --   HGB 9.1* 9.9* 8.5* 6.7*  HCT 27.8* 28.8* 24.2* 19.0*  MCV 92.1 89.4 86.4 88.0  PLT 86* 77* 71* 79*    Recent Labs Lab 04/29/15 0400 04/29/15 2205 04/30/15 0754 05/01/15 0343  WBC 5.6 8.2 8.8 6.8   Microbiology No results found for this or any previous visit (from the past 240 hour(s)).   Medications:   . calcium-vitamin D  1 tablet Oral Daily  . diclofenac  75 mg Oral BID  . donepezil  10 mg Oral QHS  . gabapentin  100-200 mg Oral QHS  . sertraline  100 mg Oral Daily   Continuous Infusions: . sodium chloride 10 mL/hr at 04/30/15 1354    Time spent: 25 minutes.   LOS: 2 days   Devin Saunders  [facility] Pager [phone]. If unable to reach me by pager, please call my cell phone at [phone].  *Please refer to [email or web address], password [email or web address] to get updated schedule on who will round on this patient, as hospitalists switch teams weekly. If 7PM-7AM, please contact night-coverage at [email or web address], password [email or web address] for any overnight needs.  05/01/2015, 7:50 AM

## 2015-05-01 NOTE — Progress Notes (Signed)
Intake 460ml since 10am on 3/25-output through foley 300 ml since 10am on 3/25,have been enc fluids po,urine is yellow clear.[place] NP- [facility] notified.orders received flushed foley with 35ml NS- at [place] will continue to moniter.pt is sleeping,and denied any bladder discomfort prior to sleep.Aggie Moats D

## 2015-05-02 ENCOUNTER — Encounter (HOSPITAL_COMMUNITY): Payer: Self-pay | Admitting: Orthopaedic Surgery

## 2015-05-02 DIAGNOSIS — K5900 Constipation, unspecified: Secondary | ICD-10-CM | POA: Diagnosis present

## 2015-05-02 DIAGNOSIS — K5901 Slow transit constipation: Secondary | ICD-10-CM

## 2015-05-02 LAB — CBC
HCT: 22 % — ABNORMAL LOW (ref 39.0–52.0)
HEMOGLOBIN: 7.6 g/dL — AB (ref 13.0–17.0)
MCH: 29.9 pg (ref 26.0–34.0)
MCHC: 34.5 g/dL (ref 30.0–36.0)
MCV: 86.6 fL (ref 78.0–100.0)
Platelets: 90 10*3/uL — ABNORMAL LOW (ref 150–400)
RBC: 2.54 MIL/uL — AB (ref 4.22–5.81)
RDW: 15.1 % (ref 11.5–15.5)
WBC: 7.1 10*3/uL (ref 4.0–10.5)

## 2015-05-02 LAB — PREPARE RBC (CROSSMATCH)

## 2015-05-02 MED ORDER — BISACODYL 5 MG PO TBEC
5.0000 mg | DELAYED_RELEASE_TABLET | Freq: Every day | ORAL | Status: DC
  Administered 2015-05-02 – 2015-05-05 (×4): 5 mg via ORAL
  Filled 2015-05-02 (×4): qty 1

## 2015-05-02 MED ORDER — SODIUM CHLORIDE 0.9 % IV SOLN: Freq: Once | INTRAVENOUS | Status: DC

## 2015-05-02 MED ORDER — BISACODYL 10 MG RE SUPP
10.0000 mg | Freq: Once | RECTAL | Status: DC
  Filled 2015-05-02: qty 1

## 2015-05-02 MED ORDER — ASPIRIN EC 325 MG PO TBEC
325.0000 mg | DELAYED_RELEASE_TABLET | Freq: Two times a day (BID) | ORAL | Status: DC
  Administered 2015-05-02: 325 mg via ORAL
  Filled 2015-05-02 (×4): qty 1

## 2015-05-02 MED FILL — Hydromorphone HCl Inj 2 MG/ML: INTRAMUSCULAR | Qty: 1 | Status: AC

## 2015-05-02 NOTE — Progress Notes (Signed)
Physical Therapy Treatment Patient Details Name: Devin Saunders MRN: [id number] DOB: 30-Jan-1946 Today's Date: 05/02/2015    History of Present Illness  pathological R hip fx -->s/p IM nail;   PMHx: dementia, prostate CA with bone mets, depression    PT Comments    Waiting to see pt after one unit of blood.  Already OOB in recliner.  Assisted with amb a very limited distance due to bloody drainage dripping down pt's leg during gait.  Returned to bed and notified RN.    Follow Up Recommendations  SNF     Equipment Recommendations       Recommendations for Other Services       Precautions / Restrictions Precautions Precautions: Fall Restrictions Weight Bearing Restrictions: No RLE Weight Bearing: Weight bearing as tolerated    Mobility  Bed Mobility Overal bed mobility: Needs Assistance Bed Mobility: Sit to Supine       Sit to supine: Mod assist   General bed mobility comments: B LE up onto bed  Transfers Overall transfer level: Needs assistance Equipment used: Rolling walker (2 wheeled) Transfers: Sit to/from Stand Sit to Stand: Min assist         General transfer comment: 50% VC's on proper hand placement and increased time  Ambulation/Gait Ambulation/Gait assistance: Min assist Ambulation Distance (Feet): 15 Feet Assistive device: Rolling walker (2 wheeled)       General Gait Details: amb distance limited by drainage from incision down pt's leg.  RN notified.    Stairs            Wheelchair Mobility    Modified Rankin (Stroke Patients Only)       Balance                                    Cognition Arousal/Alertness: Awake/alert                          Exercises      General Comments        Pertinent Vitals/Pain Pain Assessment: No/denies pain    Home Living                      Prior Function            PT Goals (current goals can now be found in the care plan section) Progress  towards PT goals: Progressing toward goals    Frequency  Min 3X/week    PT Plan Current plan remains appropriate    Co-evaluation             End of Session Equipment Utilized During Treatment: Gait belt Activity Tolerance: Patient tolerated treatment well Patient left: in bed;with call bell/phone within reach;with family/visitor present     Time: 1417-1430 PT Time Calculation (min) (ACUTE ONLY): 13 min  Charges:  $Gait Training: 8-22 mins                    G Codes:      Rica Koyanagi  PTA [facility]  Acute  Rehab Pager      [phone]

## 2015-05-02 NOTE — Addendum Note (Signed)
Addendum  created 05/02/15 0743 by Lollie Sails, CRNA   Modules edited: Charges VN

## 2015-05-02 NOTE — Progress Notes (Signed)
Progress Note   Devin Saunders [id number] DOB: July 16, 1945 DOA: 04/28/2015 PCP: Pcp Not In System   Brief Narrative:   Devin Saunders is an 70 y.o. male PMH of prostate cancer metastatic to bone who was admitted from the ED 04/28/15 after sustaining a pathologic fracture of his right hip. He subsequently underwent operative repair with an intramedullary implant on 04/29/15 by Dr. Erlinda Hong.  Assessment/Plan:   Principal Problem:  Closed Pathologic right hip fracture (HCC)In the setting of metastatic prostate cancer - ASA pre-op protocal ordered per hip fracture order set. - S/P operative repair with an intramedullary implant on 04/29/15 by Dr. Erlinda Hong.  - Activity: WBAT. PT/OT evaluations requested. - SW consult for SNF placement.  - Vicodin / Morphine ordered for pain control. - Robaxin PRN for muscle relaxation. - Bowel regimen initiated.  Active Problems:   Constipation - Give a dulcolax suppository now and start dulcolax QHS.    Thrombocytopenia - Platelet count stable, off Lovenox.    Acute kidney injury - Likely prerenal. Increase IV fluids.    Acute/postoperative blood loss anemia - Hemoglobin 7.6 this morning after being given 1 unit of PRBCs. - Orthopedic surgeon recommends transfusing to a hemoglobin of 8, so we'll give an additional unit.   Prostate cancer metastatic to bone [facility]) - F/U at the [facility].   Dementia - Continue Aricept.   Chronic leg pain - Continue Neurontin.   Depression - Continue Zoloft.   DVT prophylaxis - SCDs ordered.   Family Communication/Anticipated D/C date and plan/Code Status   Family Communication: No family currently at the bedside. Disposition Plan/date: SNF in 1-2 days. Code Status: Full code.   IV Access:    Peripheral IV   Procedures and diagnostic studies:   Ct Hip Right Wo Contrast  04/29/2015  CLINICAL DATA:  Right hip pain. Lesser trochanter fracture on plain films. EXAM: CT OF THE RIGHT HIP  WITHOUT CONTRAST TECHNIQUE: Multidetector CT imaging of the right hip was performed according to the standard protocol. Multiplanar CT image reconstructions were also generated. COMPARISON:  Right femur 04/28/2015 FINDINGS: Again demonstrated is a fracture of the lesser trochanter with superior and medial displacement of the lesser trochanteric fragment. In addition, there is a linear lucency through the base of the femoral neck consistent with a nondisplaced fracture. Linear lucency also extends obliquely across the base of the greater trochanter. No evidence of dislocation. On at 50 diffuse abnormal bone density is demonstrated with a heterogeneous sclerosis and lucency with irregular margination and irregular cortex demonstrated throughout the visualized right pelvis and with heterogeneous lucency and sclerosis in the femoral head and neck. This is consistent with underlying infiltrative bone process. Suspect metastatic involvement although myeloma or lymphoma could also possibly have this appearance. Enlarged lymph nodes are demonstrated in the right iliac chains, measuring up to 23 mm short axis dimension. Musculature medial to the proximal femur is somewhat prominent with hazy margins, suggesting intramuscular hematoma. IMPRESSION: Displaced fracture of the lesser trochanter. Additional linear nondisplaced fractures of the base of the femoral neck and of the base of the greater trochanter. There is underlying abnormal bone mineralization consistent with pathologic fractures. Bone sclerosis and lucency with cortical irregularity may indicate metastasis, myeloma, or other infiltrating bone process. Enlarged lymph nodes are present in the right iliac chains suggesting metastatic lymphadenopathy. Electronically Signed   By: Lucienne Capers M.D.   On: 04/29/2015 02:36   Dg Knee Complete 4 Views Right  04/28/2015  CLINICAL DATA:  70 year old male with right knee pain after feeling a pop in the right leg. EXAM:  RIGHT KNEE - COMPLETE 4+ VIEW COMPARISON:  None. FINDINGS: There is no acute fracture or dislocation. There is mild osteopenia. No significant arthritic changes. There is no joint effusion. The soft tissues appear unremarkable. IMPRESSION: No acute fracture or dislocation. Electronically Signed   By: Anner Crete M.D.   On: 04/28/2015 23:13   Dg C-arm 1-60 Min-no Report  04/29/2015  CLINICAL DATA: surgery C-ARM 1-60 MINUTES Fluoroscopy was utilized by the requesting physician.  No radiographic interpretation.   Dg Hip Unilat With Pelvis 2-3 Views Right  04/29/2015  CLINICAL DATA:  Intraoperative right IM nail EXAM: DG C-ARM 1-60 MIN-NO REPORT; DG HIP (WITH OR WITHOUT PELVIS) 2-3V RIGHT FLUOROSCOPY TIME:  Radiation Exposure Index (as provided by the fluoroscopic device): 25.7 mGy If the device does not provide the exposure index: Fluoroscopy Time (in minutes and seconds):  77 seconds Number of Acquired Images:  2 COMPARISON:  04/28/2015 FINDINGS: Intraoperative fluoroscopy is obtained for surgical control purposes. 2 spot fluoroscopic images obtained demonstrate internal fixation of the right proximal femur using and intra medullary rod and 2 compression screws. The distal portion of the rod is not included within the field of view. The right hip demonstrates good alignment and position. There is displacement of the lesser trochanteric fragment. Sclerosis in the innominate bone consistent with underlying bone lesion. IMPRESSION: Intraoperative fluoroscopy demonstrating internal fixation of the right hip. Electronically Signed   By: Lucienne Capers M.D.   On: 04/29/2015 19:32   Dg Femur, Min 2 Views Right  04/28/2015  CLINICAL DATA:  Right femoral pain and tightness after making a sudden movement while lying down. EXAM: RIGHT FEMUR 2 VIEWS COMPARISON:  None. FINDINGS: There is a fracture of the lesser trochanter of the right hip with superior displacement of the fracture fragment. No evidence of femoral  neck, shaft, or distal femoral fractures. Heterogeneous sclerotic appearance of the right superior and inferior pubic rami suggesting underlying bone disease. This could represent Paget's disease or metastasis. This is possibly a pathologic fracture. IMPRESSION: Displaced fracture of the lesser trochanter of the right femur, possibly pathologic. Abnormal bone sclerosis in the superior and inferior pubic rami suggesting Paget's disease or infiltrating bone disease such as metastasis. Electronically Signed   By: Lucienne Capers M.D.   On: 04/28/2015 23:13     Medical Consultants:    None.  Anti-Infectives:   Anti-infectives    Start     Dose/Rate Route Frequency Ordered Stop   04/30/15 0000  ceFAZolin (ANCEF) IVPB 2g/100 mL premix     2 g 200 mL/hr over 30 Minutes Intravenous Every 6 hours 04/29/15 2058 04/30/15 1310   04/29/15 1530  ceFAZolin (ANCEF) IVPB 2g/100 mL premix  Status:  Discontinued    Comments:  Anesthesia to give preop   2 g over 30 Minutes Intravenous  Once 04/29/15 1131 04/29/15 2050      Subjective:   Devin Saunders denies chest pain, shortness of breath, nausea. Appetite is fair. No BM today, now feels constipated. Continues to have a sore right hip.  Objective:    Filed Vitals:   05/01/15 0821 05/01/15 1336 05/01/15 2110 05/02/15 0500  BP: 110/61 110/62 122/64 130/69  Pulse: 71 71 84 73  Temp: 98.4 F (36.9 C) 98.4 F (36.9 C) 97.7 F (36.5 C) 98.4 F (36.9 C)  TempSrc: Oral Oral Oral Oral  Resp: 16 16 16 16   Height:  Weight:      SpO2: 96% 96% 96% 93%    Intake/Output Summary (Last 24 hours) at 05/02/15 0816 Last data filed at 05/01/15 2111  Gross per 24 hour  Intake    460 ml  Output    700 ml  Net   -240 ml   Filed Weights   04/28/15 2205 04/29/15 0615  Weight: 74.844 kg (165 lb) 74.844 kg (165 lb)    Exam: Gen:  NAD Cardiovascular:  RRR, No M/R/G Respiratory:  Lungs CTAB Gastrointestinal:  Abdomen soft, NT/ND, +  BS Extremities:  No C/E/C   Data Reviewed:    Labs: Basic Metabolic Panel:  Recent Labs Lab 04/29/15 0400 04/29/15 2205 04/30/15 0754 05/01/15 0343  NA 136  --  139 136  K 4.4  --  4.5 4.0  CL 104  --  108 105  CO2 22  --  21* 22  GLUCOSE 121*  --  138* 124*  BUN 19  --  18 27*  CREATININE 0.87 0.96 1.03 1.61*  CALCIUM 7.9*  --  7.2* 7.4*   GFR Estimated Creatinine Clearance: 43.5 mL/min (by C-G formula based on Cr of 1.61). Liver Function Tests:  Recent Labs Lab 04/29/15 0400  AST 45*  ALT 18  ALKPHOS 177*  BILITOT 0.5  PROT 6.3*  ALBUMIN 3.5   Coagulation profile  Recent Labs Lab 04/29/15 0400  INR 1.10    CBC:  Recent Labs Lab 04/29/15 0400 04/29/15 2205 04/30/15 0754 05/01/15 0343 05/01/15 1218 05/02/15 0425  WBC 5.6 8.2 8.8 6.8 6.8 7.1  NEUTROABS 4.6  --   --   --   --   --   HGB 9.1* 9.9* 8.5* 6.7* 7.3* 7.6*  HCT 27.8* 28.8* 24.2* 19.0* 20.5* 22.0*  MCV 92.1 89.4 86.4 88.0 85.4 86.6  PLT 86* 77* 71* 79* 77* 90*    Recent Labs Lab 04/30/15 0754 05/01/15 0343 05/01/15 1218 05/02/15 0425  WBC 8.8 6.8 6.8 7.1   Microbiology No results found for this or any previous visit (from the past 240 hour(s)).   Medications:   . calcium-vitamin D  1 tablet Oral Daily  . diclofenac  75 mg Oral BID  . donepezil  10 mg Oral QHS  . gabapentin  100-200 mg Oral QHS  . sertraline  100 mg Oral Daily   Continuous Infusions: . sodium chloride 100 mL/hr at 05/01/15 1821    Time spent: 25 minutes.   LOS: 3 days   [facility] Pager [phone]. If unable to reach me by pager, please call my cell phone at [phone].  *Please refer to [email or web address], password [email or web address] to get updated schedule on who will round on this patient, as hospitalists switch teams weekly. If 7PM-7AM, please contact night-coverage at [email or web address], password [email or web address] for any overnight needs.  05/02/2015, 8:16 AM

## 2015-05-02 NOTE — Progress Notes (Signed)
Ortho MD, Dr. Erlinda Hong about oral anticoagulant therapy. Dr. Erlinda Hong gave orders for aspirin EC 325 BID. Order added.   Dr. Erlinda Hong also informed about patients draining incision. Ortho MD requested that RN staff rub betadine swab across incisions twice daily. RN added care order instruction.

## 2015-05-02 NOTE — Progress Notes (Signed)
   Subjective:  Patient is even more alert today.  Objective:   VITALS:   Filed Vitals:   05/01/15 0821 05/01/15 1336 05/01/15 2110 05/02/15 0500  BP: 110/61 110/62 122/64 130/69  Pulse: 71 71 84 73  Temp: 98.4 F (36.9 C) 98.4 F (36.9 C) 97.7 F (36.5 C) 98.4 F (36.9 C)  TempSrc: Oral Oral Oral Oral  Resp: 16 16 16 16   Height:      Weight:      SpO2: 96% 96% 96% 93%    Neurologically intact Neurovascular intact Sensation intact distally Intact pulses distally Dorsiflexion/Plantar flexion intact Incision: dressing C/D/I and scant drainage No cellulitis present Compartment soft   Lab Results  Component Value Date   WBC 7.1 05/02/2015   HGB 7.6* 05/02/2015   HCT 22.0* 05/02/2015   MCV 86.6 05/02/2015   PLT 90* 05/02/2015     Assessment/Plan:  3 Days Post-Op   - platelets stable - recommend transfusion until Hgb above 8 - SNF pending  Marianna Payment 05/02/2015, 7:42 AM [phone]

## 2015-05-03 LAB — TYPE AND SCREEN
ABO/RH(D): A POS
Antibody Screen: NEGATIVE
UNIT DIVISION: 0
UNIT DIVISION: 0
UNIT DIVISION: 0
UNIT DIVISION: 0

## 2015-05-03 LAB — BASIC METABOLIC PANEL
Anion gap: 9 (ref 5–15)
BUN: 20 mg/dL (ref 6–20)
CHLORIDE: 108 mmol/L (ref 101–111)
CO2: 22 mmol/L (ref 22–32)
CREATININE: 0.82 mg/dL (ref 0.61–1.24)
Calcium: 8 mg/dL — ABNORMAL LOW (ref 8.9–10.3)
GFR calc non Af Amer: >60 mL/min (ref >60)
GLUCOSE: 114 mg/dL — AB (ref 65–99)
Potassium: 4.1 mmol/L (ref 3.5–5.1)
Sodium: 139 mmol/L (ref 135–145)

## 2015-05-03 LAB — CBC
HCT: 25 % — ABNORMAL LOW (ref 39.0–52.0)
HEMOGLOBIN: 8.7 g/dL — AB (ref 13.0–17.0)
MCH: 30.9 pg (ref 26.0–34.0)
MCHC: 34.8 g/dL (ref 30.0–36.0)
MCV: 88.7 fL (ref 78.0–100.0)
PLATELETS: 97 10*3/uL — AB (ref 150–400)
RBC: 2.82 MIL/uL — AB (ref 4.22–5.81)
RDW: 14.9 % (ref 11.5–15.5)
WBC: 6.9 10*3/uL (ref 4.0–10.5)

## 2015-05-03 MED ORDER — SODIUM CHLORIDE 0.9 % IV SOLN
INTRAVENOUS | Status: DC
  Administered 2015-05-03 – 2015-05-04 (×2): via INTRAVENOUS

## 2015-05-03 NOTE — Care Management Important Message (Signed)
Important Message  Patient Details  Name: Devin Saunders MRN: [id number] Date of Birth: Oct 08, 1945   Medicare Important Message Given:  Yes    Camillo Flaming 05/03/2015, 11:12 AMImportant Message  Patient Details  Name: Devin Saunders MRN: [id number] Date of Birth: Jun 23, 1945   Medicare Important Message Given:  Yes    Camillo Flaming 05/03/2015, 11:12 AM

## 2015-05-03 NOTE — Progress Notes (Signed)
Progress Note   Kagan Tibbitts [id number] DOB: 1945-09-07 DOA: 04/28/2015 PCP: Pcp Not In System   Brief Narrative:   Devin Saunders is an 70 y.o. male PMH of prostate cancer metastatic to bone who was admitted from the ED 04/28/15 after sustaining a pathologic fracture of his right hip. He subsequently underwent operative repair with an intramedullary implant on 04/29/15 by Dr. Erlinda Hong.  Assessment/Plan:   Principal Problem:  Closed Pathologic right hip fracture (HCC)In the setting of metastatic prostate cancer - ASA pre-op protocal ordered per hip fracture order set. - S/P operative repair with an intramedullary implant on 04/29/15 by Dr. Erlinda Hong.  - Activity: WBAT. PT/OT following, for SNF placement. - Vicodin / Morphine ordered for pain control. - Robaxin PRN for muscle relaxation. - Bowel regimen initiated. - Now has wound VAC in place for management of significant drainage with plans to keep in place 2-3 days.  Active Problems:   Constipation - Bowels moved with Dulcolax suppository, bowel regimen started.    Thrombocytopenia - Platelet count stable, off Lovenox.    Acute kidney injury - Resolved with IV fluids.    Acute/postoperative blood loss anemia - Hemoglobin 8.7 status post 2 units of packed red blood cells.   Prostate cancer metastatic to bone [facility]) - F/U at the [facility].   Dementia - Continue Aricept.   Chronic leg pain - Continue Neurontin.   Depression - Continue Zoloft.   DVT prophylaxis - SCDs ordered.   Family Communication/Anticipated D/C date and plan/Code Status   Family Communication: No family currently at the bedside. Disposition Plan/date: SNF in 2-3 days depending on clearance from orthopedic surgeons. Not felt to be stable for discharge secondary to copious drainage at hip wound site necessitating placement of a VAC. Code Status: Full code.   IV Access:    Peripheral IV   Procedures and diagnostic studies:   Ct Hip  Right Wo Contrast  04/29/2015  CLINICAL DATA:  Right hip pain. Lesser trochanter fracture on plain films. EXAM: CT OF THE RIGHT HIP WITHOUT CONTRAST TECHNIQUE: Multidetector CT imaging of the right hip was performed according to the standard protocol. Multiplanar CT image reconstructions were also generated. COMPARISON:  Right femur 04/28/2015 FINDINGS: Again demonstrated is a fracture of the lesser trochanter with superior and medial displacement of the lesser trochanteric fragment. In addition, there is a linear lucency through the base of the femoral neck consistent with a nondisplaced fracture. Linear lucency also extends obliquely across the base of the greater trochanter. No evidence of dislocation. On at 50 diffuse abnormal bone density is demonstrated with a heterogeneous sclerosis and lucency with irregular margination and irregular cortex demonstrated throughout the visualized right pelvis and with heterogeneous lucency and sclerosis in the femoral head and neck. This is consistent with underlying infiltrative bone process. Suspect metastatic involvement although myeloma or lymphoma could also possibly have this appearance. Enlarged lymph nodes are demonstrated in the right iliac chains, measuring up to 23 mm short axis dimension. Musculature medial to the proximal femur is somewhat prominent with hazy margins, suggesting intramuscular hematoma. IMPRESSION: Displaced fracture of the lesser trochanter. Additional linear nondisplaced fractures of the base of the femoral neck and of the base of the greater trochanter. There is underlying abnormal bone mineralization consistent with pathologic fractures. Bone sclerosis and lucency with cortical irregularity may indicate metastasis, myeloma, or other infiltrating bone process. Enlarged lymph nodes are present in the right iliac chains suggesting metastatic lymphadenopathy. Electronically Signed   By:  Lucienne Capers M.D.   On: 04/29/2015 02:36   Dg Knee  Complete 4 Views Right  04/28/2015  CLINICAL DATA:  70 year old male with right knee pain after feeling a pop in the right leg. EXAM: RIGHT KNEE - COMPLETE 4+ VIEW COMPARISON:  None. FINDINGS: There is no acute fracture or dislocation. There is mild osteopenia. No significant arthritic changes. There is no joint effusion. The soft tissues appear unremarkable. IMPRESSION: No acute fracture or dislocation. Electronically Signed   By: Anner Crete M.D.   On: 04/28/2015 23:13   Dg C-arm 1-60 Min-no Report  04/29/2015  CLINICAL DATA: surgery C-ARM 1-60 MINUTES Fluoroscopy was utilized by the requesting physician.  No radiographic interpretation.   Dg Hip Unilat With Pelvis 2-3 Views Right  04/29/2015  CLINICAL DATA:  Intraoperative right IM nail EXAM: DG C-ARM 1-60 MIN-NO REPORT; DG HIP (WITH OR WITHOUT PELVIS) 2-3V RIGHT FLUOROSCOPY TIME:  Radiation Exposure Index (as provided by the fluoroscopic device): 25.7 mGy If the device does not provide the exposure index: Fluoroscopy Time (in minutes and seconds):  77 seconds Number of Acquired Images:  2 COMPARISON:  04/28/2015 FINDINGS: Intraoperative fluoroscopy is obtained for surgical control purposes. 2 spot fluoroscopic images obtained demonstrate internal fixation of the right proximal femur using and intra medullary rod and 2 compression screws. The distal portion of the rod is not included within the field of view. The right hip demonstrates good alignment and position. There is displacement of the lesser trochanteric fragment. Sclerosis in the innominate bone consistent with underlying bone lesion. IMPRESSION: Intraoperative fluoroscopy demonstrating internal fixation of the right hip. Electronically Signed   By: Lucienne Capers M.D.   On: 04/29/2015 19:32   Dg Femur, Min 2 Views Right  04/28/2015  CLINICAL DATA:  Right femoral pain and tightness after making a sudden movement while lying down. EXAM: RIGHT FEMUR 2 VIEWS COMPARISON:  None. FINDINGS:  There is a fracture of the lesser trochanter of the right hip with superior displacement of the fracture fragment. No evidence of femoral neck, shaft, or distal femoral fractures. Heterogeneous sclerotic appearance of the right superior and inferior pubic rami suggesting underlying bone disease. This could represent Paget's disease or metastasis. This is possibly a pathologic fracture. IMPRESSION: Displaced fracture of the lesser trochanter of the right femur, possibly pathologic. Abnormal bone sclerosis in the superior and inferior pubic rami suggesting Paget's disease or infiltrating bone disease such as metastasis. Electronically Signed   By: Lucienne Capers M.D.   On: 04/28/2015 23:13     Medical Consultants:    None.  Anti-Infectives:   Anti-infectives    Start     Dose/Rate Route Frequency Ordered Stop   04/30/15 0000  ceFAZolin (ANCEF) IVPB 2g/100 mL premix     2 g 200 mL/hr over 30 Minutes Intravenous Every 6 hours 04/29/15 2058 04/30/15 1310   04/29/15 1530  ceFAZolin (ANCEF) IVPB 2g/100 mL premix  Status:  Discontinued    Comments:  Anesthesia to give preop   2 g over 30 Minutes Intravenous  Once 04/29/15 1131 04/29/15 2050      Subjective:   Adren Bissonnette says he moved his bowels yesterday. Appetite is fair. Continues to have a sore right hip. No nausea or vomiting.  Objective:    Filed Vitals:   05/02/15 1300 05/02/15 1416 05/02/15 2111 05/03/15 0443  BP: 131/64 135/61 136/67 137/60  Pulse: 71 65 68 68  Temp: 98.1 F (36.7 C) 97.5 F (36.4 C) 98.3 F (36.8  C) 98.5 F (36.9 C)  TempSrc: Oral Oral Oral Oral  Resp: 16 16 16 16   Height:      Weight:      SpO2: 98% 98% 98% 99%    Intake/Output Summary (Last 24 hours) at 05/03/15 0925 Last data filed at 05/03/15 0400  Gross per 24 hour  Intake    940 ml  Output    500 ml  Net    440 ml   Filed Weights   04/28/15 2205 04/29/15 0615  Weight: 74.844 kg (165 lb) 74.844 kg (165 lb)    Exam: Gen:   NAD Cardiovascular:  RRR, No M/R/G Respiratory:  Lungs CTAB Gastrointestinal:  Abdomen soft, NT/ND, + BS Extremities:  Right lower extremity swollen compared to left.   Data Reviewed:    Labs: Basic Metabolic Panel:  Recent Labs Lab 04/29/15 0400 04/29/15 2205 04/30/15 0754 05/01/15 0343 05/03/15 0404  NA 136  --  139 136 139  K 4.4  --  4.5 4.0 4.1  CL 104  --  108 105 108  CO2 22  --  21* 22 22  GLUCOSE 121*  --  138* 124* 114*  BUN 19  --  18 27* 20  CREATININE 0.87 0.96 1.03 1.61* 0.82  CALCIUM 7.9*  --  7.2* 7.4* 8.0*   GFR Estimated Creatinine Clearance: 85.3 mL/min (by C-G formula based on Cr of 0.82). Liver Function Tests:  Recent Labs Lab 04/29/15 0400  AST 45*  ALT 18  ALKPHOS 177*  BILITOT 0.5  PROT 6.3*  ALBUMIN 3.5   Coagulation profile  Recent Labs Lab 04/29/15 0400  INR 1.10    CBC:  Recent Labs Lab 04/29/15 0400  04/30/15 0754 05/01/15 0343 05/01/15 1218 05/02/15 0425 05/03/15 0404  WBC 5.6  < > 8.8 6.8 6.8 7.1 6.9  NEUTROABS 4.6  --   --   --   --   --   --   HGB 9.1*  < > 8.5* 6.7* 7.3* 7.6* 8.7*  HCT 27.8*  < > 24.2* 19.0* 20.5* 22.0* 25.0*  MCV 92.1  < > 86.4 88.0 85.4 86.6 88.7  PLT 86*  < > 71* 79* 77* 90* 97*  < > = values in this interval not displayed.  Recent Labs Lab 05/01/15 0343 05/01/15 1218 05/02/15 0425 05/03/15 0404  WBC 6.8 6.8 7.1 6.9   Microbiology No results found for this or any previous visit (from the past 240 hour(s)).   Medications:   . sodium chloride   Intravenous Once  . bisacodyl  5 mg Oral QHS  . bisacodyl  10 mg Rectal Once  . calcium-vitamin D  1 tablet Oral Daily  . diclofenac  75 mg Oral BID  . donepezil  10 mg Oral QHS  . gabapentin  100-200 mg Oral QHS  . sertraline  100 mg Oral Daily   Continuous Infusions: . sodium chloride 100 mL/hr at 05/01/15 1821    Time spent: 25 minutes.   LOS: 4 days   [facility] Pager [phone]. If unable to reach  me by pager, please call my cell phone at [phone].  *Please refer to [email or web address], password [email or web address] to get updated schedule on who will round on this patient, as hospitalists switch teams weekly. If 7PM-7AM, please contact night-coverage at [email or web address], password [email or web address] for any overnight needs.  05/03/2015, 9:25 AM

## 2015-05-03 NOTE — Progress Notes (Signed)
Physical Therapy Treatment Patient Details Name: Devin Saunders MRN: [id number] DOB: 1945/08/04 Today's Date: 05/03/2015    History of Present Illness  pathological R hip fx -->s/p IM nail;   PMHx: dementia, prostate CA with bone mets, depression    PT Comments    Assisted OOB to amb a greater distance then performed TE's while in recliner followed by ICE.  Follow Up Recommendations  SNF     Equipment Recommendations       Recommendations for Other Services       Precautions / Restrictions Precautions Precautions: Fall Precaution Comments: wound VAC R hip Restrictions Weight Bearing Restrictions: No RLE Weight Bearing: Weight bearing as tolerated    Mobility  Bed Mobility Overal bed mobility: Needs Assistance Bed Mobility: Supine to Sit     Supine to sit: Min assist;Mod assist     General bed mobility comments: min assist to guide and support R LE off bed and Mod assist to use use bed pad to scoot to EOB  Transfers Overall transfer level: Needs assistance Equipment used: Rolling walker (2 wheeled) Transfers: Sit to/from Stand Sit to Stand: Min assist         General transfer comment: 50% VC's on proper hand placement and increased time off elevated bed  Ambulation/Gait Ambulation/Gait assistance: Min assist Ambulation Distance (Feet): 74 Feet Assistive device: Rolling walker (2 wheeled) Gait Pattern/deviations: Step-through pattern;Decreased stride length Gait velocity: decreased   General Gait Details: tolerated increased distance and equal weight bearing.  No c/o dizziness and reports 5/10 R hip pain.     Stairs            Wheelchair Mobility    Modified Rankin (Stroke Patients Only)       Balance                                    Cognition Arousal/Alertness: Awake/alert (slow to respond but functional) Behavior During Therapy: WFL for tasks assessed/performed                        Exercises  10 reps B  AP 10 reps B knee presses 10 reps B butt sqeezes ICE applied R hip    General Comments        Pertinent Vitals/Pain Pain Assessment: 0-10 Pain Score: 5  Pain Location: R hip Pain Descriptors / Indicators: Tender;Tightness Pain Intervention(s): Monitored during session;RN gave pain meds during session;Repositioned;Ice applied    Home Living                      Prior Function            PT Goals (current goals can now be found in the care plan section) Progress towards PT goals: Progressing toward goals    Frequency  Min 3X/week    PT Plan Current plan remains appropriate    Co-evaluation             End of Session Equipment Utilized During Treatment: Gait belt Activity Tolerance: Patient tolerated treatment well Patient left: in chair;with call bell/phone within reach;with chair alarm set     Time: 1005-1030 PT Time Calculation (min) (ACUTE ONLY): 25 min  Charges:  $Gait Training: 8-22 mins $Therapeutic Exercise: 8-22 mins                    G Codes:  Rica Koyanagi  PTA [facility]  Acute  Rehab Pager      [phone]

## 2015-05-03 NOTE — Progress Notes (Signed)
   Subjective:  Patient continues to have large amt of serosanguinous drainage.  Objective:   VITALS:   Filed Vitals:   05/02/15 1300 05/02/15 1416 05/02/15 2111 05/03/15 0443  BP: 131/64 135/61 136/67 137/60  Pulse: 71 65 68 68  Temp: 98.1 F (36.7 C) 97.5 F (36.4 C) 98.3 F (36.8 C) 98.5 F (36.9 C)  TempSrc: Oral Oral Oral Oral  Resp: 16 16 16 16   Height:      Weight:      SpO2: 98% 98% 98% 99%    Serosanguinous drainage from incisions Extensive bruising of the buttock and scrotum   Lab Results  Component Value Date   WBC 6.9 05/03/2015   HGB 8.7* 05/03/2015   HCT 25.0* 05/03/2015   MCV 88.7 05/03/2015   PLT 97* 05/03/2015     Assessment/Plan:  4 Days Post-Op   - incisional VAC applied for drainage, plan to keep 2-3 days until drainage stops - all anticoagulants stopped - risk of bleeding, hematoma, infection is significant - continue PT - not stable for dc to SNF yet due to drainage  Marianna Payment 05/03/2015, 8:04 AM [phone]

## 2015-05-04 LAB — BASIC METABOLIC PANEL
ANION GAP: 8 (ref 5–15)
BUN: 18 mg/dL (ref 6–20)
CHLORIDE: 107 mmol/L (ref 101–111)
CO2: 23 mmol/L (ref 22–32)
Calcium: 8.1 mg/dL — ABNORMAL LOW (ref 8.9–10.3)
Creatinine, Ser: 0.87 mg/dL (ref 0.61–1.24)
GFR calc Af Amer: >60 mL/min (ref >60)
GLUCOSE: 105 mg/dL — AB (ref 65–99)
POTASSIUM: 4 mmol/L (ref 3.5–5.1)
Sodium: 138 mmol/L (ref 135–145)

## 2015-05-04 LAB — CBC
HCT: 25.6 % — ABNORMAL LOW (ref 39.0–52.0)
HEMOGLOBIN: 8.8 g/dL — AB (ref 13.0–17.0)
MCH: 29.9 pg (ref 26.0–34.0)
MCHC: 34.4 g/dL (ref 30.0–36.0)
MCV: 87.1 fL (ref 78.0–100.0)
PLATELETS: 119 10*3/uL — AB (ref 150–400)
RBC: 2.94 MIL/uL — AB (ref 4.22–5.81)
RDW: 14.9 % (ref 11.5–15.5)
WBC: 7.3 10*3/uL (ref 4.0–10.5)

## 2015-05-04 NOTE — Progress Notes (Signed)
   Subjective:  Patient not eating well.  Objective:   VITALS:   Filed Vitals:   05/03/15 0443 05/03/15 1309 05/03/15 2157 05/04/15 0553  BP: 137/60 138/63 156/74 135/54  Pulse: 68 79 80 76  Temp: 98.5 F (36.9 C) 98.2 F (36.8 C) 97.8 F (36.6 C) 98.8 F (37.1 C)  TempSrc: Oral Oral Oral Oral  Resp: 16 18 16 16   Height:      Weight:      SpO2: 99% 99% 97% 98%    iVAC with good seal and suction   Lab Results  Component Value Date   WBC 7.3 05/04/2015   HGB 8.8* 05/04/2015   HCT 25.6* 05/04/2015   MCV 87.1 05/04/2015   PLT 119* 05/04/2015     Assessment/Plan:  5 Days Post-Op   - will take off VAC tomorrow to look at drainage - Hgb stable - bone scan and MRI from [place] shows innumerable widespread bony mets - prognosis is poor  Marianna Payment 05/04/2015, 7:51 AM [phone]

## 2015-05-04 NOTE — Progress Notes (Signed)
[facility]                                                                              Patient Demographics  Devin Saunders, is a 70 y.o. male, DOB - 1945/03/09, WN:[id number]  Admit date - 04/28/2015   Admitting Physician Rise Patience, MD  Outpatient Primary MD for the patient is Pcp Not In System  LOS - 5   Chief Complaint  Patient presents with  . Leg Injury      Interim history Devin Saunders is an 70 y.o. male PMH of prostate cancer metastatic to bone who was admitted from the ED 04/28/15 after sustaining a pathologic fracture of his right hip. He subsequently underwent operative repair with an intramedullary implant on 04/29/15 by Dr. Erlinda Hong.  Assessment & Plan   Closed Pathologic right hip fracture (HCC)In the setting of metastatic prostate cancer -ASA pre-op protocal ordered per hip fracture order set. -S/P operative repair with an intramedullary implant on 04/29/15 by Dr. Erlinda Hong.  -Activity: WBAT. PT/OT following, for SNF placement. -Vicodin / Morphine ordered for pain control. -Robaxin PRN for muscle relaxation- Possibly pick another muscle relaxant upon discharge (expensive for SNF) -Bowel regimen initiated. -Now has wound VAC in place for management of significant drainage with plans to take off wound VAC 05/05/2015 to evaulate wound and drainage.   Constipation -Bowels moved with Dulcolax suppository, Continue bowel reigmen  Thrombocytopenia -Platelet count stable, off Lovenox.  Acute kidney injury -Resolved with IV fluids.  Acute/postoperative blood loss anemia -Hemoglobin 8.8  -patient received 2uPRBCs this admission -Continue to monitor CBC  Prostate cancer metastatic to bone [facility]) -F/U at the [facility] -Palliative care consulted to take about symptom management and end of life goals  Dementia -Continue Aricept.  Chronic leg pain -Continue Neurontin.  Depression -Continue Zoloft  Goals of care -Spoke with patient's wife regarding  status and patient's wishes -She would like to speak with palliative care  Code Status: Full  Family Communication: Wife at bedside  Disposition Plan: Admitted. Pending SNF in [place], [place]  Time Spent in minutes   30 minutes  Procedures  S/P operative repair with an intramedullary implant on 04/29/15 by Dr. Erlinda Hong  Consults   Orthopedic surgery  DVT Prophylaxis  SCDs  Lab Results  Component Value Date   PLT 119* 05/04/2015    Medications  Scheduled Meds: . sodium chloride   Intravenous Once  . bisacodyl  5 mg Oral QHS  . bisacodyl  10 mg Rectal Once  . calcium-vitamin D  1 tablet Oral Daily  . diclofenac  75 mg Oral BID  . donepezil  10 mg Oral QHS  . gabapentin  100-200 mg Oral QHS  . sertraline  100 mg Oral Daily   Continuous Infusions: . sodium chloride Stopped (05/04/15 1030)   PRN Meds:.acetaminophen **OR** acetaminophen, alum & mag hydroxide-simeth, HYDROcodone-acetaminophen, ibuprofen, menthol-cetylpyridinium **OR** phenol, methocarbamol **OR** methocarbamol (ROBAXIN)  IV, metoCLOPramide **OR** metoCLOPramide (REGLAN) injection, morphine injection, ondansetron **OR** ondansetron (ZOFRAN) IV, oxyCODONE, polyethylene glycol  Antibiotics    Anti-infectives    Start     Dose/Rate Route Frequency Ordered Stop   04/30/15 0000  ceFAZolin (ANCEF) IVPB 2g/100 mL premix  2 g 200 mL/hr over 30 Minutes Intravenous Every 6 hours 04/29/15 2058 04/30/15 1310   04/29/15 1530  ceFAZolin (ANCEF) IVPB 2g/100 mL premix  Status:  Discontinued    Comments:  Anesthesia to give preop   2 g over 30 Minutes Intravenous  Once 04/29/15 1131 04/29/15 2050     Subjective:   Devin Saunders seen and examined today.  Patient has no complaints today. Denies pain, chest pain, shortness of breath, abdominal pain.   Objective:   Filed Vitals:   05/03/15 0443 05/03/15 1309 05/03/15 2157 05/04/15 0553  BP: 137/60 138/63 156/74 135/54  Pulse: 68 79 80 76  Temp: 98.5 F (36.9 C) 98.2 F  (36.8 C) 97.8 F (36.6 C) 98.8 F (37.1 C)  TempSrc: Oral Oral Oral Oral  Resp: 16 18 16 16   Height:      Weight:      SpO2: 99% 99% 97% 98%    Wt Readings from Last 3 Encounters:  04/29/15 74.844 kg (165 lb)     Intake/Output Summary (Last 24 hours) at 05/04/15 1629 Last data filed at 05/04/15 1030  Gross per 24 hour  Intake    270 ml  Output    100 ml  Net    170 ml    Exam  General: Well developed, well nourished, NAD, appears stated age  HEENT: NCAT, mucous membranes moist.   Cardiovascular: S1 S2 auscultated, RRR, no murmurs  Respiratory: Clear to auscultation bilaterally   Abdomen: Soft, nontender, nondistended, + bowel sounds  Extremities: warm dry without cyanosis clubbing. RLE > LLE.  Neuro: AAOx2, nonfocal  Data Review   Micro Results No results found for this or any previous visit (from the past 240 hour(s)).  Radiology Reports Ct Hip Right Wo Contrast  04/29/2015  CLINICAL DATA:  Right hip pain. Lesser trochanter fracture on plain films. EXAM: CT OF THE RIGHT HIP WITHOUT CONTRAST TECHNIQUE: Multidetector CT imaging of the right hip was performed according to the standard protocol. Multiplanar CT image reconstructions were also generated. COMPARISON:  Right femur 04/28/2015 FINDINGS: Again demonstrated is a fracture of the lesser trochanter with superior and medial displacement of the lesser trochanteric fragment. In addition, there is a linear lucency through the base of the femoral neck consistent with a nondisplaced fracture. Linear lucency also extends obliquely across the base of the greater trochanter. No evidence of dislocation. On at 50 diffuse abnormal bone density is demonstrated with a heterogeneous sclerosis and lucency with irregular margination and irregular cortex demonstrated throughout the visualized right pelvis and with heterogeneous lucency and sclerosis in the femoral head and neck. This is consistent with underlying infiltrative bone  process. Suspect metastatic involvement although myeloma or lymphoma could also possibly have this appearance. Enlarged lymph nodes are demonstrated in the right iliac chains, measuring up to 23 mm short axis dimension. Musculature medial to the proximal femur is somewhat prominent with hazy margins, suggesting intramuscular hematoma. IMPRESSION: Displaced fracture of the lesser trochanter. Additional linear nondisplaced fractures of the base of the femoral neck and of the base of the greater trochanter. There is underlying abnormal bone mineralization consistent with pathologic fractures. Bone sclerosis and lucency with cortical irregularity may indicate metastasis, myeloma, or other infiltrating bone process. Enlarged lymph nodes are present in the right iliac chains suggesting metastatic lymphadenopathy. Electronically Signed   By: Lucienne Capers M.D.   On: 04/29/2015 02:36   Dg Knee Complete 4 Views Right  04/28/2015  CLINICAL DATA:  70 year old male with  right knee pain after feeling a pop in the right leg. EXAM: RIGHT KNEE - COMPLETE 4+ VIEW COMPARISON:  None. FINDINGS: There is no acute fracture or dislocation. There is mild osteopenia. No significant arthritic changes. There is no joint effusion. The soft tissues appear unremarkable. IMPRESSION: No acute fracture or dislocation. Electronically Signed   By: Anner Crete M.D.   On: 04/28/2015 23:13   Dg C-arm 1-60 Min-no Report  04/29/2015  CLINICAL DATA: surgery C-ARM 1-60 MINUTES Fluoroscopy was utilized by the requesting physician.  No radiographic interpretation.   Dg Hip Unilat With Pelvis 2-3 Views Right  04/29/2015  CLINICAL DATA:  Intraoperative right IM nail EXAM: DG C-ARM 1-60 MIN-NO REPORT; DG HIP (WITH OR WITHOUT PELVIS) 2-3V RIGHT FLUOROSCOPY TIME:  Radiation Exposure Index (as provided by the fluoroscopic device): 25.7 mGy If the device does not provide the exposure index: Fluoroscopy Time (in minutes and seconds):  77 seconds  Number of Acquired Images:  2 COMPARISON:  04/28/2015 FINDINGS: Intraoperative fluoroscopy is obtained for surgical control purposes. 2 spot fluoroscopic images obtained demonstrate internal fixation of the right proximal femur using and intra medullary rod and 2 compression screws. The distal portion of the rod is not included within the field of view. The right hip demonstrates good alignment and position. There is displacement of the lesser trochanteric fragment. Sclerosis in the innominate bone consistent with underlying bone lesion. IMPRESSION: Intraoperative fluoroscopy demonstrating internal fixation of the right hip. Electronically Signed   By: Lucienne Capers M.D.   On: 04/29/2015 19:32   Dg Femur, Min 2 Views Right  04/28/2015  CLINICAL DATA:  Right femoral pain and tightness after making a sudden movement while lying down. EXAM: RIGHT FEMUR 2 VIEWS COMPARISON:  None. FINDINGS: There is a fracture of the lesser trochanter of the right hip with superior displacement of the fracture fragment. No evidence of femoral neck, shaft, or distal femoral fractures. Heterogeneous sclerotic appearance of the right superior and inferior pubic rami suggesting underlying bone disease. This could represent Paget's disease or metastasis. This is possibly a pathologic fracture. IMPRESSION: Displaced fracture of the lesser trochanter of the right femur, possibly pathologic. Abnormal bone sclerosis in the superior and inferior pubic rami suggesting Paget's disease or infiltrating bone disease such as metastasis. Electronically Signed   By: Lucienne Capers M.D.   On: 04/28/2015 23:13    CBC  Recent Labs Lab 04/29/15 0400  05/01/15 0343 05/01/15 1218 05/02/15 0425 05/03/15 0404 05/04/15 0354  WBC 5.6  < > 6.8 6.8 7.1 6.9 7.3  HGB 9.1*  < > 6.7* 7.3* 7.6* 8.7* 8.8*  HCT 27.8*  < > 19.0* 20.5* 22.0* 25.0* 25.6*  PLT 86*  < > 79* 77* 90* 97* 119*  MCV 92.1  < > 88.0 85.4 86.6 88.7 87.1  MCH 30.1  < > 31.0  30.4 29.9 30.9 29.9  MCHC 32.7  < > 35.3 35.6 34.5 34.8 34.4  RDW 14.4  < > 14.7 14.5 15.1 14.9 14.9  LYMPHSABS 0.6*  --   --   --   --   --   --   MONOABS 0.4  --   --   --   --   --   --   EOSABS 0.0  --   --   --   --   --   --   BASOSABS 0.0  --   --   --   --   --   --   < > =  values in this interval not displayed.  Chemistries   Recent Labs Lab 04/29/15 0400 04/29/15 2205 04/30/15 0754 05/01/15 0343 05/03/15 0404 05/04/15 0354  NA 136  --  139 136 139 138  K 4.4  --  4.5 4.0 4.1 4.0  CL 104  --  108 105 108 107  CO2 22  --  21* 22 22 23   GLUCOSE 121*  --  138* 124* 114* 105*  BUN 19  --  18 27* 20 18  CREATININE 0.87 0.96 1.03 1.61* 0.82 0.87  CALCIUM 7.9*  --  7.2* 7.4* 8.0* 8.1*  AST 45*  --   --   --   --   --   ALT 18  --   --   --   --   --   ALKPHOS 177*  --   --   --   --   --   BILITOT 0.5  --   --   --   --   --    ------------------------------------------------------------------------------------------------------------------ estimated creatinine clearance is 80.4 mL/min (by C-G formula based on Cr of 0.87). ------------------------------------------------------------------------------------------------------------------ No results for input(s): HGBA1C in the last 72 hours. ------------------------------------------------------------------------------------------------------------------ No results for input(s): CHOL, HDL, LDLCALC, TRIG, CHOLHDL, LDLDIRECT in the last 72 hours. ------------------------------------------------------------------------------------------------------------------ No results for input(s): TSH, T4TOTAL, T3FREE, THYROIDAB in the last 72 hours.  Invalid input(s): FREET3 ------------------------------------------------------------------------------------------------------------------ No results for input(s): VITAMINB12, FOLATE, FERRITIN, TIBC, IRON, RETICCTPCT in the last 72 hours.  Coagulation profile  Recent Labs Lab  04/29/15 0400  INR 1.10    No results for input(s): DDIMER in the last 72 hours.  Cardiac Enzymes No results for input(s): CKMB, TROPONINI, MYOGLOBIN in the last 168 hours.  Invalid input(s): CK ------------------------------------------------------------------------------------------------------------------ Invalid input(s): POCBNP    Geoffrey Mankin D.O. on 05/04/2015 at 4:29 PM  Between 7am to 7pm - Pager - [phone]  After 7pm go to [email or web address] - password [email or web address]  And look for the night coverage person covering for me after hours  [facility] Office  [phone]

## 2015-05-04 NOTE — Progress Notes (Signed)
Occupational Therapy Treatment Patient Details Name: Devin Saunders MRN: [id number] DOB: 02-Jul-1945 Today's Date: 05/04/2015    History of present illness  pathological R hip fx -->s/p IM nail;   PMHx: dementia, prostate CA with bone mets, depression   OT comments  Pt participating well. Cues for sequence and safety.  IV had come out and pt was unaware.  Follow Up Recommendations  SNF    Equipment Recommendations  3 in 1 bedside comode    Recommendations for Other Services      Precautions / Restrictions Precautions Precautions: Fall Precaution Comments: wound VAC R hip Restrictions RLE Weight Bearing: Weight bearing as tolerated       Mobility Bed Mobility   Bed Mobility: Supine to Sit     Supine to sit: Min assist     General bed mobility comments: assist for RLE and trunk.  Pt able to scoot forward with extra time  Transfers   Equipment used: Rolling walker (2 wheeled) Transfers: Sit to/from Stand Sit to Stand: Min assist         General transfer comment: cues for UE/LE placement    Balance                                   ADL Overall ADL's : Needs assistance/impaired                 Upper Body Dressing : Minimal assistance;Sitting       Toilet Transfer: Minimal assistance;Stand-pivot (chair)             General ADL Comments: When OT arrived, IV was out of arm and sheets/gown were bloody.  Assisted pt to chair and gown was changed.  Pt was able to reach for items on table accurately this session.  He states that he does not have any double vision      Vision                     Perception     Praxis      Cognition   Behavior During Therapy: WFL for tasks assessed/performed Overall Cognitive Status: History of cognitive impairments - at baseline                       Extremity/Trunk Assessment               Exercises     Shoulder Instructions       General Comments       Pertinent Vitals/ Pain       Pain Assessment: Faces Faces Pain Scale: Hurts a little bit Pain Location: R hip Pain Descriptors / Indicators: Sore Pain Intervention(s): Limited activity within patient's tolerance;Monitored during session;Premedicated before session;Repositioned  Home Living                                          Prior Functioning/Environment              Frequency Min 2X/week     Progress Toward Goals  OT Goals(current goals can now be found in the care plan section)  Progress towards OT goals: Progressing toward goals     Plan      Co-evaluation  End of Session     Activity Tolerance Patient tolerated treatment well   Patient Left in chair;with call bell/phone within reach;with chair alarm set   Nurse Communication          Time: [phone] OT Time Calculation (min): 25 min  Charges: OT General Charges $OT Visit: 1 Procedure OT Treatments $Therapeutic Activity: 8-22 mins  Nile Prisk 05/04/2015, 12:06 PM  Lesle Chris, OTR/L [phone] 05/04/2015

## 2015-05-04 NOTE — Progress Notes (Signed)
   05/04/15 1500  Clinical Encounter Type  Visited With Patient  Visit Type Initial;Spiritual support;Psychological support;Social support  Referral From Palliative care team  Spiritual Encounters  Spiritual Needs Prayer;Emotional  Ch visited with pt; pt not having a great day in his assessment; [place] offered prayer of healing and offered spiritual support; pt acknowledged missing Church on Sunday and was appreciative of prayer. 3:09 PM Gwynn Burly

## 2015-05-05 DIAGNOSIS — Z515 Encounter for palliative care: Secondary | ICD-10-CM

## 2015-05-05 DIAGNOSIS — M899 Disorder of bone, unspecified: Secondary | ICD-10-CM

## 2015-05-05 DIAGNOSIS — D62 Acute posthemorrhagic anemia: Secondary | ICD-10-CM

## 2015-05-05 LAB — CBC
HCT: 25.7 % — ABNORMAL LOW (ref 39.0–52.0)
Hemoglobin: 8.8 g/dL — ABNORMAL LOW (ref 13.0–17.0)
MCH: 29.8 pg (ref 26.0–34.0)
MCHC: 34.2 g/dL (ref 30.0–36.0)
MCV: 87.1 fL (ref 78.0–100.0)
PLATELETS: 123 10*3/uL — AB (ref 150–400)
RBC: 2.95 MIL/uL — AB (ref 4.22–5.81)
RDW: 14.9 % (ref 11.5–15.5)
WBC: 9 10*3/uL (ref 4.0–10.5)

## 2015-05-05 LAB — BASIC METABOLIC PANEL
Anion gap: 9 (ref 5–15)
BUN: 16 mg/dL (ref 6–20)
CO2: 23 mmol/L (ref 22–32)
CREATININE: 0.76 mg/dL (ref 0.61–1.24)
Calcium: 7.9 mg/dL — ABNORMAL LOW (ref 8.9–10.3)
Chloride: 106 mmol/L (ref 101–111)
GFR calc Af Amer: >60 mL/min (ref >60)
GLUCOSE: 92 mg/dL (ref 65–99)
POTASSIUM: 4 mmol/L (ref 3.5–5.1)
SODIUM: 138 mmol/L (ref 135–145)

## 2015-05-05 MED ORDER — CEPHALEXIN 500 MG PO CAPS
500.0000 mg | ORAL_CAPSULE | Freq: Four times a day (QID) | ORAL | Status: DC
  Administered 2015-05-05 – 2015-05-06 (×4): 500 mg via ORAL
  Filled 2015-05-05 (×7): qty 1

## 2015-05-05 NOTE — Progress Notes (Signed)
Dr, Erlinda Hong called nurse. Nurse explained wife's concern regarding bruising. Dr. Erlinda Hong is aware of moderate amount of drainage on dressing. Nurse gave Dr. Erlinda Hong wife's telephone number [phone] as written on patients board in room.

## 2015-05-05 NOTE — Progress Notes (Signed)
Patients wife concerned about bruising across the bottom of patients back and 2 areas of bruising on lower back. Wife was upset voicing that the 2 spots were fingerprints. Nurse assessed patients back. There is bruising across lower back and 2 small, circular  bruised areas to left, lower back. Areas do not appear to be fingerprints. Wife requested provider to come into room. Nurse called Dr. Erlinda Hong. Dr. Erlinda Hong is in surgery and will come to bedside later in the day. Wife is aware and agrees for Dr. Erlinda Hong to call her cell phone, number is listed on board in patients room. Nurse explained to wife, patient had bruising to right hip and left upper chest area, the lower back bruising could be the bruising connecting. Nurse explained Lovenox and aspirin are blood thinners and had been discontinued. Wife voices understanding and is awaiting call from Dr. Erlinda Hong.

## 2015-05-05 NOTE — Progress Notes (Signed)
Physical Therapy Treatment Patient Details Name: Statham Stormont MRN: [id number] DOB: [birth year] Today's Date: 05/05/2015    History of Present Illness  pathological R hip fx -->s/p IM nail;   PMHx: dementia, prostate CA with bone mets, depression    PT Comments    Wound VAC D/C'd.  Assisted OOB to amb a greater distance.  No drainage coming from hip.  Returned to room then performed TE's AAROM.    Follow Up Recommendations  SNF     Equipment Recommendations  Rolling walker with 5" wheels    Recommendations for Other Services       Precautions / Restrictions Precautions Precautions: Fall Precaution Comments: none Restrictions Weight Bearing Restrictions: No RLE Weight Bearing: Weight bearing as tolerated    Mobility  Bed Mobility Overal bed mobility: Needs Assistance Bed Mobility: Supine to Sit     Supine to sit: Mod assist     General bed mobility comments: assist for RLE and trunk.  Pt able to scoot forward with extra time  Transfers Overall transfer level: Needs assistance Equipment used: Rolling walker (2 wheeled) Transfers: Sit to/from Stand Sit to Stand: Min assist         General transfer comment: cues for UE/LE placement plus increased time  Ambulation/Gait Ambulation/Gait assistance: Min assist Ambulation Distance (Feet): 82 Feet Assistive device: Rolling walker (2 wheeled) Gait Pattern/deviations: Step-to pattern;Step-through pattern Gait velocity: decreased   General Gait Details: tolerated increased distance and equal weight bearing.  No c/o dizziness.  No drainage from incision during gait.     Stairs            Wheelchair Mobility    Modified Rankin (Stroke Patients Only)       Balance                                    Cognition Arousal/Alertness: Awake/alert Behavior During Therapy: WFL for tasks assessed/performed Overall Cognitive Status: History of cognitive impairments - at baseline                       Exercises      General Comments        Pertinent Vitals/Pain Pain Assessment: 0-10 Pain Score: 2  Pain Location: R hip Pain Descriptors / Indicators: Aching;Sore Pain Intervention(s): Monitored during session;Repositioned;Ice applied    Home Living                      Prior Function            PT Goals (current goals can now be found in the care plan section) Progress towards PT goals: Progressing toward goals    Frequency  Min 3X/week    PT Plan Current plan remains appropriate    Co-evaluation             End of Session Equipment Utilized During Treatment: Gait belt Activity Tolerance: Patient tolerated treatment well Patient left: in chair;with call bell/phone within reach;with chair alarm set     Time: 1105-1130 PT Time Calculation (min) (ACUTE ONLY): 25 min  Charges:  $Gait Training: 8-22 mins $Therapeutic Exercise: 8-22 mins                    G Codes:      Rica Koyanagi  PTA [facility]  Acute  Rehab Pager      [phone]

## 2015-05-05 NOTE — Progress Notes (Signed)
I removed his iVAC today and replaced it with dry dressings.  I want to watch this for today to make sure he doesn't continue to drain.  If he continues to drain, then I will place a prevena VAC that he can discharge to SNF with.  I've asked to the RN to notify me if he continues to drain.  Azucena Cecil, MD [place] 7:19 AM

## 2015-05-05 NOTE — Progress Notes (Signed)
Nurse changed dressing to right hip. Nurse swabbed with betadine and applied mepilex dressing. Nurse noted moderate serosanguinous drainage on dressing that was removed.

## 2015-05-05 NOTE — Consult Note (Signed)
Consultation Note Date: 05/05/2015   Patient Name: Devin Saunders  DOB: 03/12/1945  MRN: [id number]  Age / Sex: 70 y.o., male  PCP: Pcp Not In System Referring Physician: Modena Jansky, MD  Reason for Consultation: Establishing goals of care, Non pain symptom management, Pain control and Psychosocial/spiritual support    Clinical Assessment/Narrative:  70 y.o. male the PMH of prostate cancer metastatic to bone who was admitted from the ED 04/28/15 after sustaining a pathologic fracture of his right hip. S/P repair.    This NP Wadie Lessen reviewed medical records, received report from team, assessed the patient and then meet at the patient's bedside and then with wife/Deseree   [phone] by telephone  to discuss diagnosis prognosis, [place], EOL wishes disposition and options.  Much of family's concern are around anticipatory care needs.    A  discussion was had today regarding advanced directives.  Concepts specific to code status, artifical feeding and hydration, continued IV antibiotics and rehospitalization was had.  The difference between a aggressive medical intervention path  and a palliative comfort care path for this patient at this time was had.  Values and goals of care important to patient and family were attempted to be elicited.  Questions and concerns addressed. Family encouraged to call with questions or concerns.  PMT will continue to support holistically.  Primary Decision Maker: Patient with support of family HCPOA: yes / wife    SUMMARY OF RECOMMENDATIONS  -treat the treatable, hopeful for return to baseline -open to all offered and available medical interventions to prolong life -dc to SNF for rehabilitation  Code Status/Advance Care Planning: DNR    Code Status Orders        Start     Ordered   04/29/15 0722  Full code   Continuous     04/29/15 0723    Code Status History    Date Active Date Inactive Code Status Order ID Comments User Context   This patient has a current code status but no historical code status.    Advance Directive Documentation        Most Recent Value   Type of Advance Directive  Healthcare Power of Attorney, Living will   Pre-existing out of facility DNR order (yellow form or pink MOST form)     "MOST" Form in Place?        Symptom Management:    Pain:  Vicodin- 1-2 tablets po every 6 hrs prn  Continue PT/OT  Psycho-social/Spiritual:   Support System: [place]   Discharge Planning: SNF for rehabilitation   Chief Complaint/ Primary Diagnoses: Present on Admission:  . Closed right hip fracture ([place]) . (Resolved) Femur fracture ([place]) . (Resolved) Hip fracture ([place]) . Prostate cancer metastatic to bone ([place]) . Dementia . Chronic leg pain . Depression . Pathologic fracture . Constipation  I have reviewed the medical record, interviewed the patient and family, and examined the patient. The following aspects are pertinent.  Past Medical History  Diagnosis Date  . Prostate cancer metastatic to bone ([place])   . Dementia     versus a "breakdown"  . Erectile dysfunction   . Chronic leg pain     On neurontin  . Depression    Social History   Social History  . Marital Status: Married    Spouse Name: N/A  . Number of Children: 1  . Years of Education: N/A   Social History Main Topics  . Smoking status: Former Research scientist (life sciences)  . Smokeless tobacco: None  .  Alcohol Use: No  . Drug Use: No  . Sexual Activity: Not Asked   Other Topics Concern  . None   Social History Narrative   Separated from wife, lives alone. Independent of ADLs and ambulation prior to admission.   Family History  Problem Relation Age of Onset  . Lung cancer Mother   . Mental illness Brother    Scheduled Meds: . sodium chloride   Intravenous Once  . bisacodyl  5 mg Oral QHS  . bisacodyl  10 mg Rectal Once  . calcium-vitamin D  1 tablet Oral Daily  .  diclofenac  75 mg Oral BID  . donepezil  10 mg Oral QHS  . gabapentin  100-200 mg Oral QHS  . sertraline  100 mg Oral Daily   Continuous Infusions: . sodium chloride 50 mL/hr at 05/04/15 1100   PRN Meds:.acetaminophen **OR** acetaminophen, alum & mag hydroxide-simeth, HYDROcodone-acetaminophen, ibuprofen, menthol-cetylpyridinium **OR** phenol, methocarbamol **OR** methocarbamol (ROBAXIN)  IV, metoCLOPramide **OR** metoCLOPramide (REGLAN) injection, morphine injection, ondansetron **OR** ondansetron (ZOFRAN) IV, oxyCODONE, polyethylene glycol Medications Prior to Admission:  Prior to Admission medications   Medication Sig Start Date End Date Taking? Authorizing Provider  Calcium Carbonate-Vitamin D (CALCIUM 600+D) 600-400 MG-UNIT tablet Take 1 tablet by mouth daily.   Yes Historical Provider, MD  diclofenac (VOLTAREN) 75 MG EC tablet Take 75 mg by mouth 2 (two) times daily.   Yes Historical Provider, MD  donepezil (ARICEPT) 10 MG tablet Take 10 mg by mouth at bedtime.   Yes Historical Provider, MD  gabapentin (NEURONTIN) 100 MG capsule Take 100-200 mg by mouth at bedtime.   Yes Historical Provider, MD  Glucosamine HCl 1000 MG TABS Take 1 tablet by mouth daily.   Yes Historical Provider, MD  HYDROcodone-acetaminophen (NORCO/VICODIN) 5-325 MG tablet Take 1 tablet by mouth 2 (two) times daily as needed for moderate pain.   Yes Historical Provider, MD  ibuprofen (ADVIL,MOTRIN) 200 MG tablet Take 200 mg by mouth every 6 (six) hours as needed for headache or moderate pain.   Yes Historical Provider, MD  sertraline (ZOLOFT) 100 MG tablet Take 100 mg by mouth daily.   Yes Historical Provider, MD  sildenafil (VIAGRA) 100 MG tablet Take 100 mg by mouth daily as needed for erectile dysfunction.   Yes Historical Provider, MD  enoxaparin (LOVENOX) 40 MG/0.4ML injection Inject 0.4 mLs (40 mg total) into the skin daily. 04/29/15   [place] Ephriam Jenkins, MD  oxyCODONE-acetaminophen (PERCOCET) 5-325 MG tablet Take 1-2  tablets by mouth every 4 (four) hours as needed for severe pain. 04/29/15   Leandrew Koyanagi, MD   No Known Allergies  Review of Systems  Constitutional: Positive for activity change and fatigue.    Physical Exam  Constitutional: He appears ill.  Cardiovascular: Normal rate, regular rhythm and normal heart sounds.   Respiratory: Effort normal and breath sounds normal.  GI: Soft. Normal appearance and bowel sounds are normal.  Neurological: He is alert.  Skin: Skin is warm and dry.  -noted right side echymosis, right hip dressing dry and intact    Vital Signs: BP 138/58 mmHg  Pulse 76  Temp(Src) 98.7 F (37.1 C) (Oral)  Resp 18  Ht 5\' 10"  (1.778 m)  Wt 74.844 kg (165 lb)  BMI 23.68 kg/m2  SpO2 98%  SpO2: SpO2: 98 % O2 Device:SpO2: 98 % O2 Flow Rate: .O2 Flow Rate (L/min): 2 L/min  IO: Intake/output summary:  Intake/Output Summary (Last 24 hours) at 05/05/15 1037 Last data filed at  05/05/15 0700  Gross per 24 hour  Intake   1000 ml  Output    310 ml  Net    690 ml    LBM: Last BM Date: 05/02/15 Baseline Weight: Weight: 74.844 kg (165 lb) Most recent weight: Weight: 74.844 kg (165 lb)      Palliative Assessment/Data:    Additional Data Reviewed:  CBC:    Component Value Date/Time   WBC 9.0 05/05/2015 0359   HGB 8.8* 05/05/2015 0359   HCT 25.7* 05/05/2015 0359   PLT 123* 05/05/2015 0359   MCV 87.1 05/05/2015 0359   NEUTROABS 4.6 04/29/2015 0400   LYMPHSABS 0.6* 04/29/2015 0400   MONOABS 0.4 04/29/2015 0400   EOSABS 0.0 04/29/2015 0400   BASOSABS 0.0 04/29/2015 0400   Comprehensive Metabolic Panel:    Component Value Date/Time   NA 138 05/05/2015 0359   K 4.0 05/05/2015 0359   CL 106 05/05/2015 0359   CO2 23 05/05/2015 0359   BUN 16 05/05/2015 0359   CREATININE 0.76 05/05/2015 0359   GLUCOSE 92 05/05/2015 0359   CALCIUM 7.9* 05/05/2015 0359   AST 45* 04/29/2015 0400   ALT 18 04/29/2015 0400   ALKPHOS 177* 04/29/2015 0400   BILITOT 0.5 04/29/2015  0400   PROT 6.3* 04/29/2015 0400   ALBUMIN 3.5 04/29/2015 0400   Discussed with Dr Exie Parody  Time In: 1015 Time Out: 1130 Time Total: 75 min Greater than 50%  of this time was spent counseling and coordinating care related to the above assessment and plan.  Signed by: Wadie Lessen, NP  Knox Royalty, NP  05/05/2015, 10:37 AM  Please contact Palliative Medicine Team phone at [phone] for questions and concerns.

## 2015-05-05 NOTE — Progress Notes (Signed)
[facility]                                                                              Patient Demographics  Devin Saunders, is a 70 y.o. male, DOB - 02-28-1945, [id number]  Admit date - 04/28/2015   Admitting Physician Rise Patience, MD  Outpatient Primary MD for the patient is Pcp Not In System  LOS - 6   Chief Complaint  Patient presents with  . Leg Injury      Interim history Devin Saunders is an 70 y.o. male PMH of prostate cancer metastatic to bone who was admitted from the ED 04/28/15 after sustaining a pathologic fracture of his right hip. He subsequently underwent operative repair with an intramedullary implant on 04/29/15 by Dr. Erlinda Hong.  Assessment & Plan   Closed Pathologic right hip fracture (HCC)In the setting of metastatic prostate cancer -ASA pre-op protocal ordered per hip fracture order set. -S/P operative repair with an intramedullary implant on 04/29/15 by Dr. Erlinda Hong.  -Activity: WBAT. PT/OT following, for SNF placement. -Vicodin / Morphine ordered for pain control. -Robaxin PRN for muscle relaxation- Possibly pick another muscle relaxant upon discharge (expensive for SNF) -Bowel regimen initiated. -Now has wound VAC in place for management of significant drainage with plans to take off wound VAC 05/05/2015 to evaulate wound and drainage.  - Wound VAC removed by orthopedics to assess amount of drainage and orthopedics are following.  Constipation -Bowels moved with Dulcolax suppository, Continue bowel reigmen  Thrombocytopenia -Platelet count stable, off Lovenox.  Acute kidney injury -Resolved with IV fluids.  Acute/postoperative blood loss anemia -Hemoglobin stable in the 8 g range. -patient received 2uPRBCs this admission  Prostate cancer metastatic to bone [facility]) -F/U at the [facility] -Palliative care consulted to take about symptom management and end of life goals  Dementia -Continue Aricept.  Chronic leg pain -Continue  Neurontin.  Depression -Continue Zoloft  Goals of care -Spoke with patient's wife regarding status and patient's wishes -She would like to speak with palliative care  Code Status: Full  Family Communication: None at bedside  Disposition Plan: Admitted. Pending SNF in [place], [place]  Time Spent in minutes   30 minutes  Procedures  S/P operative repair with an intramedullary implant on 04/29/15 by Dr. Erlinda Hong  Consults   Orthopedic surgery  DVT Prophylaxis  SCDs   Medications  Scheduled Meds: . sodium chloride   Intravenous Once  . bisacodyl  5 mg Oral QHS  . bisacodyl  10 mg Rectal Once  . calcium-vitamin D  1 tablet Oral Daily  . cephALEXin  500 mg Oral 4 times per day  . diclofenac  75 mg Oral BID  . donepezil  10 mg Oral QHS  . gabapentin  100-200 mg Oral QHS  . sertraline  100 mg Oral Daily   Continuous Infusions: . sodium chloride 50 mL/hr at 05/04/15 1100   PRN Meds:.acetaminophen **OR** acetaminophen, alum & mag hydroxide-simeth, HYDROcodone-acetaminophen, ibuprofen, menthol-cetylpyridinium **OR** phenol, methocarbamol **OR** methocarbamol (ROBAXIN)  IV, metoCLOPramide **OR** metoCLOPramide (REGLAN) injection, morphine injection, ondansetron **OR** ondansetron (ZOFRAN) IV, oxyCODONE, polyethylene glycol  Antibiotics    Anti-infectives    Start     Dose/Rate Route  Frequency Ordered Stop   05/05/15 1800  cephALEXin (KEFLEX) capsule 500 mg     500 mg Oral 4 times per day 05/05/15 1656     04/30/15 0000  ceFAZolin (ANCEF) IVPB 2g/100 mL premix     2 g 200 mL/hr over 30 Minutes Intravenous Every 6 hours 04/29/15 2058 04/30/15 1310   04/29/15 1530  ceFAZolin (ANCEF) IVPB 2g/100 mL premix  Status:  Discontinued    Comments:  Anesthesia to give preop   2 g over 30 Minutes Intravenous  Once 04/29/15 1131 04/29/15 2050     Subjective:   Appropriate right hip area pain. Denies any other complaints. Palliative care team at bedside.  Objective:   Filed Vitals:    05/03/15 2157 05/04/15 0553 05/05/15 0517 05/05/15 1332  BP: 156/74 135/54 138/58 120/58  Pulse: 80 76 76 67  Temp: 97.8 F (36.6 C) 98.8 F (37.1 C) 98.7 F (37.1 C) 97.6 F (36.4 C)  TempSrc: Oral Oral Oral Axillary  Resp: 16 16 18 17   Height:      Weight:      SpO2: 97% 98% 98% 97%    Wt Readings from Last 3 Encounters:  04/29/15 74.844 kg (165 lb)     Intake/Output Summary (Last 24 hours) at 05/05/15 1737 Last data filed at 05/05/15 1101  Gross per 24 hour  Intake   1120 ml  Output    410 ml  Net    710 ml    Exam  General: Well developed, well nourished, NAD, appears stated age  HEENT: NCAT, mucous membranes moist.   Cardiovascular: S1 S2 auscultated, RRR, no murmurs. No JVD.  Respiratory: Clear to auscultation bilaterally . No increased work of breathing.  Abdomen: Soft, nontender, nondistended, + bowel sounds  Extremities: warm dry without cyanosis clubbing. RLE > LLE. Right hip surgical site dressing clean and dry but extensive area of bruising as per picture below (3/30).  Neuro: AAOx2, nonfocal       Data Review   Micro Results No results found for this or any previous visit (from the past 240 hour(s)).  Radiology Reports Ct Hip Right Wo Contrast  04/29/2015  CLINICAL DATA:  Right hip pain. Lesser trochanter fracture on plain films. EXAM: CT OF THE RIGHT HIP WITHOUT CONTRAST TECHNIQUE: Multidetector CT imaging of the right hip was performed according to the standard protocol. Multiplanar CT image reconstructions were also generated. COMPARISON:  Right femur 04/28/2015 FINDINGS: Again demonstrated is a fracture of the lesser trochanter with superior and medial displacement of the lesser trochanteric fragment. In addition, there is a linear lucency through the base of the femoral neck consistent with a nondisplaced fracture. Linear lucency also extends obliquely across the base of the greater trochanter. No evidence of dislocation. On at 50 diffuse  abnormal bone density is demonstrated with a heterogeneous sclerosis and lucency with irregular margination and irregular cortex demonstrated throughout the visualized right pelvis and with heterogeneous lucency and sclerosis in the femoral head and neck. This is consistent with underlying infiltrative bone process. Suspect metastatic involvement although myeloma or lymphoma could also possibly have this appearance. Enlarged lymph nodes are demonstrated in the right iliac chains, measuring up to 23 mm short axis dimension. Musculature medial to the proximal femur is somewhat prominent with hazy margins, suggesting intramuscular hematoma. IMPRESSION: Displaced fracture of the lesser trochanter. Additional linear nondisplaced fractures of the base of the femoral neck and of the base of the greater trochanter. There is underlying abnormal bone mineralization  consistent with pathologic fractures. Bone sclerosis and lucency with cortical irregularity may indicate metastasis, myeloma, or other infiltrating bone process. Enlarged lymph nodes are present in the right iliac chains suggesting metastatic lymphadenopathy. Electronically Signed   By: Lucienne Capers M.D.   On: 04/29/2015 02:36   Dg Knee Complete 4 Views Right  04/28/2015  CLINICAL DATA:  70 year old male with right knee pain after feeling a pop in the right leg. EXAM: RIGHT KNEE - COMPLETE 4+ VIEW COMPARISON:  None. FINDINGS: There is no acute fracture or dislocation. There is mild osteopenia. No significant arthritic changes. There is no joint effusion. The soft tissues appear unremarkable. IMPRESSION: No acute fracture or dislocation. Electronically Signed   By: Anner Crete M.D.   On: 04/28/2015 23:13   Dg C-arm 1-60 Min-no Report  04/29/2015  CLINICAL DATA: surgery C-ARM 1-60 MINUTES Fluoroscopy was utilized by the requesting physician.  No radiographic interpretation.   Dg Hip Unilat With Pelvis 2-3 Views Right  04/29/2015  CLINICAL DATA:   Intraoperative right IM nail EXAM: DG C-ARM 1-60 MIN-NO REPORT; DG HIP (WITH OR WITHOUT PELVIS) 2-3V RIGHT FLUOROSCOPY TIME:  Radiation Exposure Index (as provided by the fluoroscopic device): 25.7 mGy If the device does not provide the exposure index: Fluoroscopy Time (in minutes and seconds):  77 seconds Number of Acquired Images:  2 COMPARISON:  04/28/2015 FINDINGS: Intraoperative fluoroscopy is obtained for surgical control purposes. 2 spot fluoroscopic images obtained demonstrate internal fixation of the right proximal femur using and intra medullary rod and 2 compression screws. The distal portion of the rod is not included within the field of view. The right hip demonstrates good alignment and position. There is displacement of the lesser trochanteric fragment. Sclerosis in the innominate bone consistent with underlying bone lesion. IMPRESSION: Intraoperative fluoroscopy demonstrating internal fixation of the right hip. Electronically Signed   By: Lucienne Capers M.D.   On: 04/29/2015 19:32   Dg Femur, Min 2 Views Right  04/28/2015  CLINICAL DATA:  Right femoral pain and tightness after making a sudden movement while lying down. EXAM: RIGHT FEMUR 2 VIEWS COMPARISON:  None. FINDINGS: There is a fracture of the lesser trochanter of the right hip with superior displacement of the fracture fragment. No evidence of femoral neck, shaft, or distal femoral fractures. Heterogeneous sclerotic appearance of the right superior and inferior pubic rami suggesting underlying bone disease. This could represent Paget's disease or metastasis. This is possibly a pathologic fracture. IMPRESSION: Displaced fracture of the lesser trochanter of the right femur, possibly pathologic. Abnormal bone sclerosis in the superior and inferior pubic rami suggesting Paget's disease or infiltrating bone disease such as metastasis. Electronically Signed   By: Lucienne Capers M.D.   On: 04/28/2015 23:13    CBC  Recent Labs Lab  04/29/15 0400  05/01/15 1218 05/02/15 0425 05/03/15 0404 05/04/15 0354 05/05/15 0359  WBC 5.6  < > 6.8 7.1 6.9 7.3 9.0  HGB 9.1*  < > 7.3* 7.6* 8.7* 8.8* 8.8*  HCT 27.8*  < > 20.5* 22.0* 25.0* 25.6* 25.7*  PLT 86*  < > 77* 90* 97* 119* 123*  MCV 92.1  < > 85.4 86.6 88.7 87.1 87.1  MCH 30.1  < > 30.4 29.9 30.9 29.9 29.8  MCHC 32.7  < > 35.6 34.5 34.8 34.4 34.2  RDW 14.4  < > 14.5 15.1 14.9 14.9 14.9  LYMPHSABS 0.6*  --   --   --   --   --   --  MONOABS 0.4  --   --   --   --   --   --   EOSABS 0.0  --   --   --   --   --   --   BASOSABS 0.0  --   --   --   --   --   --   < > = values in this interval not displayed.  Chemistries   Recent Labs Lab 04/29/15 0400  04/30/15 0754 05/01/15 0343 05/03/15 0404 05/04/15 0354 05/05/15 0359  NA 136  --  139 136 139 138 138  K 4.4  --  4.5 4.0 4.1 4.0 4.0  CL 104  --  108 105 108 107 106  CO2 22  --  21* 22 22 23 23   GLUCOSE 121*  --  138* 124* 114* 105* 92  BUN 19  --  18 27* 20 18 16   CREATININE 0.87  < > 1.03 1.61* 0.82 0.87 0.76  CALCIUM 7.9*  --  7.2* 7.4* 8.0* 8.1* 7.9*  AST 45*  --   --   --   --   --   --   ALT 18  --   --   --   --   --   --   ALKPHOS 177*  --   --   --   --   --   --   BILITOT 0.5  --   --   --   --   --   --   < > = values in this interval not displayed. ------------------------------------------------------------------------------------------------------------------ estimated creatinine clearance is 87.4 mL/min (by C-G formula based on Cr of 0.76). ------------------------------------------------------------------------------------------------------------------ No results for input(s): HGBA1C in the last 72 hours. ------------------------------------------------------------------------------------------------------------------ No results for input(s): CHOL, HDL, LDLCALC, TRIG, CHOLHDL, LDLDIRECT in the last 72  hours. ------------------------------------------------------------------------------------------------------------------ No results for input(s): TSH, T4TOTAL, T3FREE, THYROIDAB in the last 72 hours.  Invalid input(s): FREET3 ------------------------------------------------------------------------------------------------------------------ No results for input(s): VITAMINB12, FOLATE, FERRITIN, TIBC, IRON, RETICCTPCT in the last 72 hours.  Coagulation profile  Recent Labs Lab 04/29/15 0400  INR 1.10    No results for input(s): DDIMER in the last 72 hours.  Cardiac Enzymes No results for input(s): CKMB, TROPONINI, MYOGLOBIN in the last 168 hours.  Invalid input(s): CK ------------------------------------------------------------------------------------------------------------------ Invalid input(s): POCBNP    Rhylen Shaheen, MD, FACP, FHM. [facility] Pager [phone]  If 7PM-7AM, please contact night-coverage [email or web address] Password [email or web address] 05/05/2015, 5:42 PM

## 2015-05-06 DIAGNOSIS — C7951 Secondary malignant neoplasm of bone: Secondary | ICD-10-CM

## 2015-05-06 DIAGNOSIS — S72001D Fracture of unspecified part of neck of right femur, subsequent encounter for closed fracture with routine healing: Secondary | ICD-10-CM

## 2015-05-06 DIAGNOSIS — G8929 Other chronic pain: Secondary | ICD-10-CM

## 2015-05-06 DIAGNOSIS — M79606 Pain in leg, unspecified: Secondary | ICD-10-CM

## 2015-05-06 DIAGNOSIS — M898X9 Other specified disorders of bone, unspecified site: Secondary | ICD-10-CM | POA: Insufficient documentation

## 2015-05-06 DIAGNOSIS — Z515 Encounter for palliative care: Secondary | ICD-10-CM | POA: Insufficient documentation

## 2015-05-06 DIAGNOSIS — Z66 Do not resuscitate: Secondary | ICD-10-CM

## 2015-05-06 DIAGNOSIS — M8440XD Pathological fracture, unspecified site, subsequent encounter for fracture with routine healing: Secondary | ICD-10-CM

## 2015-05-06 DIAGNOSIS — F0390 Unspecified dementia without behavioral disturbance: Secondary | ICD-10-CM

## 2015-05-06 DIAGNOSIS — C61 Malignant neoplasm of prostate: Secondary | ICD-10-CM

## 2015-05-06 LAB — CREATININE, SERUM
Creatinine, Ser: 0.81 mg/dL (ref 0.61–1.24)
GFR calc non Af Amer: >60 mL/min (ref >60)

## 2015-05-06 LAB — CBC
HEMATOCRIT: 24 % — AB (ref 39.0–52.0)
HEMOGLOBIN: 8.2 g/dL — AB (ref 13.0–17.0)
MCH: 30.6 pg (ref 26.0–34.0)
MCHC: 34.2 g/dL (ref 30.0–36.0)
MCV: 89.6 fL (ref 78.0–100.0)
Platelets: 117 10*3/uL — ABNORMAL LOW (ref 150–400)
RBC: 2.68 MIL/uL — ABNORMAL LOW (ref 4.22–5.81)
RDW: 15.1 % (ref 11.5–15.5)
WBC: 8.1 10*3/uL (ref 4.0–10.5)

## 2015-05-06 MED ORDER — CEPHALEXIN 500 MG PO CAPS: 500.0000 mg | ORAL_CAPSULE | Freq: Four times a day (QID) | ORAL | Status: AC

## 2015-05-06 MED ORDER — BISACODYL 5 MG PO TBEC: 5.0000 mg | DELAYED_RELEASE_TABLET | Freq: Every day | ORAL | Status: AC

## 2015-05-06 MED ORDER — ASPIRIN EC 325 MG PO TBEC: 325.0000 mg | DELAYED_RELEASE_TABLET | Freq: Every day | ORAL | Status: AC

## 2015-05-06 NOTE — Discharge Summary (Signed)
Physician Discharge Summary  Devin Saunders  HWE:[id number]  DOB: 1945-04-12  DOA: 04/28/2015  PCP: Pcp Not In System/[facility]  Admit date: 04/28/2015 Discharge date: 05/06/2015  Time spent: Greater than 30 minutes  Recommendations for Outpatient Follow-up:  1. M.D. at SNF in 3 days with repeat labs (CBC & BMP). 2. Dr. Leandrew Koyanagi, Orthopedics in 1-2 weeks for postop wound check.  Discharge Diagnoses:  Principal Problem:   Closed right hip fracture ([place]) Active Problems:   Prostate cancer metastatic to bone (HCC)   Dementia   Chronic leg pain   Depression   Pathologic fracture   Postoperative anemia due to acute blood loss   Constipation   Palliative care encounter   DNR (do not resuscitate)   Bone pain   Discharge Condition: Improved & Stable  Diet recommendation: Heart healthy diet.  Filed Weights   04/28/15 2205 04/29/15 0615  Weight: 74.844 kg (165 lb) 74.844 kg (165 lb)    History of present illness:  70 year old male with history of metastatic prostate cancer to bone, dementia, chronic leg pain, depression, erectile dysfunction, sustained a pathological fracture of his right hip when he attempted to roll over his right side in bed and heard a pop followed by pain and swelling. Initial evaluation in the ED with CT scanned revealed displaced fracture of the lesser trochanter along with linear nondisplaced fractures of the base of the femoral neck and base of the greater trochanter. These were felt to be pathological fractures related to bony metastasis. Orthopedics was consulted. As per history, patient is separated from his wife and has had increased falls at home prior to this admission.  Hospital Course:   Closed pathological right hip fracture in the setting of metastatic prostate cancer - Orthopedics was consulted and after preoperative clearance, patient underwent surgery. - Patient had treatment of intertrochanteric fracture with intramedullary implant on  04/29/15. - Postoperatively, noted large amount of serosanguineous drainage from surgical site. Incisional VAC was applied on 3/28. - VAC was removed on 3/30. Orthopedics evaluated again today and recommend betadine paint to incisions twice a day and a 10 day course of Keflex and cleared him for discharge to SNF. - I discussed with Dr. Erlinda Hong, orthopedics who recommended following: Weightbearing as tolerated on right lower extremity, Betadine paint followed by dry dressing to incisions twice a day, complete 10 day course of Keflex, DC Lovenox and start aspirin 325 MG daily for 6 weeks from surgery date for DVT prophylaxis, follow up with him in 1-2 weeks for postop wound check. - Discontinued NSAIDs due to risk of GI/renal complications.  Postop acute blood loss anemia - Patient was transfused 1 unit of PRBC on 05/01/15 for hemoglobin of 6.7 and an additional unit on 3/27. - Hemoglobin has been stable in the 8.7-8.2 range since 3/28. Follow CBC in the next couple of days & ensure hemoglobin remains >8 g per DL.  Thrombocytopenia - Likely secondary to acute blood loss. Lovenox had to be briefly discontinued. Improved and stable. - Patient has 2 areas of bruising, larger over right hip/flank and smaller one over the left upper anterior chest which may be related to the thrombocytopenia and the one over the hip from surgical intervention. These seem to be several days old and not progressing clinically.  Acute Kidney injury - Likely prerenal and resolved after brief IV fluids.  Metastatic prostate cancer to bone - Care is delivered at [facility] - As per orthopedic note[facility] with poor prognosis. - Palliative care team met with patient on 05/05/15 with following recommendations: Treat the treatable, hopeful for return to baseline, open to all offered and available medical interventions to prolong life, DC to SNF for rehabilitation and  patient is DO NOT RESUSCITATE.  Dementia  Chronic leg pain - Continue Neurontin  Depression - Continue Zoloft  Constipation - Patient had BM with bowel regimen.  Adult failure to thrive - Secondary to metastatic prostate cancer  DO NOT RESUSCITATE  Consultations:  Orthopedics/Dr. Erlinda Hong  Palliative care medicine  Procedures:  S/P operative repair with an intramedullary implant on 04/29/15 by Dr. Erlinda Hong   Discharge Exam:  Complaints:  Complains of mild intermittent pain at surgical site but denies any other complaints. As per RN, no acute issues.  Filed Vitals:   05/05/15 0517 05/05/15 1332 05/05/15 2215 05/06/15 0511  BP: 138/58 120/58 138/58 120/61  Pulse: 76 67 71 70  Temp: 98.7 F (37.1 C) 97.6 F (36.4 C) 98.8 F (37.1 C) 98.1 F (36.7 C)  TempSrc: Oral Axillary Oral Oral  Resp: 18 17 17 16   Height:      Weight:      SpO2: 98% 97% 97% 98%    General exam: Pleasant elderly male lying comfortably supine in bed. Respiratory system: Clear. No increased work of breathing. Palm sized are of superficial bruising over the left upper anterior chest-no worse than yesterday. Cardiovascular system: S1 & S2 heard, RRR. No JVD, murmurs, gallops, clicks or pedal edema. Gastrointestinal system: Abdomen is nondistended, soft and nontender. Normal bowel sounds heard. Central nervous system: Alert and oriented. No focal neurological deficits. Extremities: Symmetric 5 x 5 power. Right hip surgical site dressing clean and dry. Skin: Extensive area of bruising noted over right hip and flank (as per picture below on 3/30). Also has a smaller palm sized area of superficial bruising over left upper anterior chest.      Discharge Instructions      Discharge Instructions    Call MD for:  difficulty breathing, headache or visual disturbances    Complete by:  As directed      Call MD for:  extreme fatigue    Complete by:  As directed      Call MD for:  persistant dizziness or  light-headedness    Complete by:  As directed      Call MD for:  persistant nausea and vomiting    Complete by:  As directed      Call MD for:  redness, tenderness, or signs of infection (pain, swelling, redness, odor or green/yellow discharge around incision site)    Complete by:  As directed      Call MD for:  severe uncontrolled pain    Complete by:  As directed      Call MD for:  temperature >100.4    Complete by:  As directed      Diet - low sodium heart healthy    Complete by:  As directed      Discharge wound care:    Complete by:  As directed   Betadine paint to incisions and dry dressing twice a day & as needed for soiling.     Increase activity slowly    Complete by:  As directed      Weight bearing as tolerated    Complete by:  As directed             Medication List  STOP taking these medications        diclofenac 75 MG EC tablet  Commonly known as:  VOLTAREN     HYDROcodone-acetaminophen 5-325 MG tablet  Commonly known as:  NORCO/VICODIN     ibuprofen 200 MG tablet  Commonly known as:  ADVIL,MOTRIN      TAKE these medications        aspirin EC 325 MG tablet  Take 1 tablet (325 mg total) by mouth daily. To be taken for 6 weeks postop (surgery date 04/29/15) and then discontinue.     bisacodyl 5 MG EC tablet  Commonly known as:  DULCOLAX  Take 1 tablet (5 mg total) by mouth at bedtime.     CALCIUM 600+D 600-400 MG-UNIT tablet  Generic drug:  Calcium Carbonate-Vitamin D  Take 1 tablet by mouth daily.     cephALEXin 500 MG capsule  Commonly known as:  KEFLEX  Take 1 capsule (500 mg total) by mouth 4 (four) times daily.     donepezil 10 MG tablet  Commonly known as:  ARICEPT  Take 10 mg by mouth at bedtime.     gabapentin 100 MG capsule  Commonly known as:  NEURONTIN  Take 100-200 mg by mouth at bedtime.     Glucosamine HCl 1000 MG Tabs  Take 1 tablet by mouth daily.     oxyCODONE-acetaminophen 5-325 MG tablet  Commonly known as:  PERCOCET    Take 1-2 tablets by mouth every 4 (four) hours as needed for severe pain.     sertraline 100 MG tablet  Commonly known as:  ZOLOFT  Take 100 mg by mouth daily.     sildenafil 100 MG tablet  Commonly known as:  VIAGRA  Take 100 mg by mouth daily as needed for erectile dysfunction.       Follow-up Information    Follow up with Marianna Payment, MD In 2 weeks.   Specialty:  Orthopedic Surgery   Why:  For suture removal, For wound re-check   Contact information:   [address] [place] [id number] [phone]       Get Medicines reviewed and adjusted: Please take all your medications with you for your next visit with your Primary MD  Please request your Primary MD to go over all hospital tests and procedure/radiological results at the follow up. Please ask your Primary MD to get all Hospital records sent to his/her office.  If you experience worsening of your admission symptoms, develop shortness of breath, life threatening emergency, suicidal or homicidal thoughts you must seek medical attention immediately by calling 911 or calling your MD immediately if symptoms less severe.  You must read complete instructions/literature along with all the possible adverse reactions/side effects for all the Medicines you take and that have been prescribed to you. Take any new Medicines after you have completely understood and accept all the possible adverse reactions/side effects.   Do not drive when taking pain medications.   Do not take more than prescribed Pain, Sleep and Anxiety Medications  Special Instructions: If you have smoked or chewed Tobacco in the last 2 yrs please stop smoking, stop any regular Alcohol and or any Recreational drug use.  Wear Seat belts while driving.  Please note  You were cared for by a hospitalist during your hospital stay. Once you are discharged, your primary care physician will handle any further medical issues. Please note that NO  REFILLS for any discharge medications will be authorized once you are  discharged, as it is imperative that you return to your primary care physician (or establish a relationship with a primary care physician if you do not have one) for your aftercare needs so that they can reassess your need for medications and monitor your lab values.    The results of significant diagnostics from this hospitalization (including imaging, microbiology, ancillary and laboratory) are listed below for reference.    Significant Diagnostic Studies: Ct Hip Right Wo Contrast  04/29/2015  CLINICAL DATA:  Right hip pain. Lesser trochanter fracture on plain films. EXAM: CT OF THE RIGHT HIP WITHOUT CONTRAST TECHNIQUE: Multidetector CT imaging of the right hip was performed according to the standard protocol. Multiplanar CT image reconstructions were also generated. COMPARISON:  Right femur 04/28/2015 FINDINGS: Again demonstrated is a fracture of the lesser trochanter with superior and medial displacement of the lesser trochanteric fragment. In addition, there is a linear lucency through the base of the femoral neck consistent with a nondisplaced fracture. Linear lucency also extends obliquely across the base of the greater trochanter. No evidence of dislocation. On at 50 diffuse abnormal bone density is demonstrated with a heterogeneous sclerosis and lucency with irregular margination and irregular cortex demonstrated throughout the visualized right pelvis and with heterogeneous lucency and sclerosis in the femoral head and neck. This is consistent with underlying infiltrative bone process. Suspect metastatic involvement although myeloma or lymphoma could also possibly have this appearance. Enlarged lymph nodes are demonstrated in the right iliac chains, measuring up to 23 mm short axis dimension. Musculature medial to the proximal femur is somewhat prominent with hazy margins, suggesting intramuscular hematoma. IMPRESSION: Displaced  fracture of the lesser trochanter. Additional linear nondisplaced fractures of the base of the femoral neck and of the base of the greater trochanter. There is underlying abnormal bone mineralization consistent with pathologic fractures. Bone sclerosis and lucency with cortical irregularity may indicate metastasis, myeloma, or other infiltrating bone process. Enlarged lymph nodes are present in the right iliac chains suggesting metastatic lymphadenopathy. Electronically Signed   By: Lucienne Capers M.D.   On: 04/29/2015 02:36   Dg Knee Complete 4 Views Right  04/28/2015  CLINICAL DATA:  70 year old male with right knee pain after feeling a pop in the right leg. EXAM: RIGHT KNEE - COMPLETE 4+ VIEW COMPARISON:  None. FINDINGS: There is no acute fracture or dislocation. There is mild osteopenia. No significant arthritic changes. There is no joint effusion. The soft tissues appear unremarkable. IMPRESSION: No acute fracture or dislocation. Electronically Signed   By: Anner Crete M.D.   On: 04/28/2015 23:13   Dg C-arm 1-60 Min-no Report  04/29/2015  CLINICAL DATA: surgery C-ARM 1-60 MINUTES Fluoroscopy was utilized by the requesting physician.  No radiographic interpretation.   Dg Hip Unilat With Pelvis 2-3 Views Right  04/29/2015  CLINICAL DATA:  Intraoperative right IM nail EXAM: DG C-ARM 1-60 MIN-NO REPORT; DG HIP (WITH OR WITHOUT PELVIS) 2-3V RIGHT FLUOROSCOPY TIME:  Radiation Exposure Index (as provided by the fluoroscopic device): 25.7 mGy If the device does not provide the exposure index: Fluoroscopy Time (in minutes and seconds):  77 seconds Number of Acquired Images:  2 COMPARISON:  04/28/2015 FINDINGS: Intraoperative fluoroscopy is obtained for surgical control purposes. 2 spot fluoroscopic images obtained demonstrate internal fixation of the right proximal femur using and intra medullary rod and 2 compression screws. The distal portion of the rod is not included within the field of view. The  right hip demonstrates good alignment and position. There is displacement  of the lesser trochanteric fragment. Sclerosis in the innominate bone consistent with underlying bone lesion. IMPRESSION: Intraoperative fluoroscopy demonstrating internal fixation of the right hip. Electronically Signed   By: Lucienne Capers M.D.   On: 04/29/2015 19:32   Dg Femur, Min 2 Views Right  04/28/2015  CLINICAL DATA:  Right femoral pain and tightness after making a sudden movement while lying down. EXAM: RIGHT FEMUR 2 VIEWS COMPARISON:  None. FINDINGS: There is a fracture of the lesser trochanter of the right hip with superior displacement of the fracture fragment. No evidence of femoral neck, shaft, or distal femoral fractures. Heterogeneous sclerotic appearance of the right superior and inferior pubic rami suggesting underlying bone disease. This could represent Paget's disease or metastasis. This is possibly a pathologic fracture. IMPRESSION: Displaced fracture of the lesser trochanter of the right femur, possibly pathologic. Abnormal bone sclerosis in the superior and inferior pubic rami suggesting Paget's disease or infiltrating bone disease such as metastasis. Electronically Signed   By: Lucienne Capers M.D.   On: 04/28/2015 23:13    Microbiology: No results found for this or any previous visit (from the past 240 hour(s)).   Labs: Basic Metabolic Panel:  Recent Labs Lab 04/30/15 0754 05/01/15 0343 05/03/15 0404 05/04/15 0354 05/05/15 0359 05/06/15 0357  NA 139 136 139 138 138  --   K 4.5 4.0 4.1 4.0 4.0  --   CL 108 105 108 107 106  --   CO2 21* 22 22 23 23   --   GLUCOSE 138* 124* 114* 105* 92  --   BUN 18 27* 20 18 16   --   CREATININE 1.03 1.61* 0.82 0.87 0.76 0.81  CALCIUM 7.2* 7.4* 8.0* 8.1* 7.9*  --    Liver Function Tests: No results for input(s): AST, ALT, ALKPHOS, BILITOT, PROT, ALBUMIN in the last 168 hours. No results for input(s): LIPASE, AMYLASE in the last 168 hours. No results for  input(s): AMMONIA in the last 168 hours. CBC:  Recent Labs Lab 05/02/15 0425 05/03/15 0404 05/04/15 0354 05/05/15 0359 05/06/15 0357  WBC 7.1 6.9 7.3 9.0 8.1  HGB 7.6* 8.7* 8.8* 8.8* 8.2*  HCT 22.0* 25.0* 25.6* 25.7* 24.0*  MCV 86.6 88.7 87.1 87.1 89.6  PLT 90* 97* 119* 123* 117*   Cardiac Enzymes: No results for input(s): CKTOTAL, CKMB, CKMBINDEX, TROPONINI in the last 168 hours. BNP: BNP (last 3 results) No results for input(s): BNP in the last 8760 hours.  ProBNP (last 3 results) No results for input(s): PROBNP in the last 8760 hours.  CBG: No results for input(s): GLUCAP in the last 168 hours.     Signed:  Vernell Leep, MD, FACP, FHM. [facility] Pager [phone]  If 7PM-7AM, please contact night-coverage [email or web address] Password [email or web address] 05/06/2015, 1:37 PM

## 2015-05-06 NOTE — Clinical Social Work Placement (Signed)
   CLINICAL SOCIAL WORK PLACEMENT  NOTE  Date:  05/06/2015  Patient Details  Name: Devin Saunders MRN: [id number] Date of Birth: [birth year]  Clinical Social Work is seeking post-discharge placement for this patient at the [place] level of care (*CSW will initial, date and re-position this form in  chart as items are completed):  Yes   Patient/family provided with [place] Work Department's list of facilities offering this level of care within the geographic area requested by the patient (or if unable, by the patient's family).  Yes   Patient/family informed of their freedom to choose among providers that offer the needed level of care, that participate in Medicare, Medicaid or managed care program needed by the patient, have an available bed and are willing to accept the patient.  Yes   Patient/family informed of [facility]'s ownership interest in [facility], as well as of the fact that they are under no obligation to receive care at these facilities.  PASRR submitted to EDS on 05/05/15     PASRR number received on 05/05/15     Existing PASRR number confirmed on       FL2 transmitted to all facilities in geographic area requested by pt/family on       FL2 transmitted to all facilities within larger geographic area on       Patient informed that his/her managed care company has contracts with or will negotiate with certain facilities, including the following:        Yes   Patient/family informed of bed offers received.  Patient chooses bed at  ([facility])     Physician recommends and patient chooses bed at      Patient to be transferred to  ([facility]) on 05/06/15.  Patient to be transferred to facility by [facility] [place]     Patient family notified on 05/06/15 of transfer.  Name of family member notified:  SPOUSE     PHYSICIAN       Additional Comment: Pt / spouse are in agreement with d/c to [facility] today. PT  has approved transport by car. D/C Summary ghas been sent to SNF for review. Scripts have been included in d/c packet. D/C packet has been provided to pt. # for report has been provided to nsg.   _______________________________________________ Luretha Rued, [place] [phone] 05/06/2015, 3:08 PM

## 2015-05-06 NOTE — Progress Notes (Signed)
Physical Therapy Treatment Patient Details Name: Devin Saunders MRN: [id number] DOB: 29-Sep-1945 Today's Date: 05/06/2015    History of Present Illness  pathological R hip fx -->s/p IM nail;   PMHx: dementia, prostate CA with bone mets, depression    PT Comments    Pt sleeping most of the morning.  Required MAX encouragement to get OOB.  Appears withdrawn.  Assisted OOB to bathroom.  Assisted in bathroom with hygiene due to balance.  Amb to recliner and positioned to comfort.    Follow Up Recommendations  SNF     Equipment Recommendations       Recommendations for Other Services       Precautions / Restrictions      Mobility  Bed Mobility Overal bed mobility: Needs Assistance Bed Mobility: Supine to Sit     Supine to sit: Min assist;Mod assist     General bed mobility comments: assist for RLE and trunk.  Pt able to scoot forward with extra time  Transfers Overall transfer level: Needs assistance Equipment used: Rolling walker (2 wheeled) Transfers: Sit to/from Stand Sit to Stand: Min assist         General transfer comment: cues for UE/LE placement plus increased time  Ambulation/Gait Ambulation/Gait assistance: Min assist Ambulation Distance (Feet): 16 Feet Assistive device: Rolling walker (2 wheeled) Gait Pattern/deviations: Step-to pattern;Decreased stance time - right;Trunk flexed Gait velocity: decreased   General Gait Details: pt only agreed to amb to and from bathroom.     Stairs            Wheelchair Mobility    Modified Rankin (Stroke Patients Only)       Balance                                    Cognition   Behavior During Therapy: Flat affect                        Exercises      General Comments        Pertinent Vitals/Pain Pain Assessment: No/denies pain    Home Living                      Prior Function            PT Goals (current goals can now be found in the care plan  section) Progress towards PT goals: Progressing toward goals    Frequency  Min 3X/week    PT Plan Current plan remains appropriate    Co-evaluation             End of Session Equipment Utilized During Treatment: Gait belt Activity Tolerance: Patient tolerated treatment well Patient left: in chair;with call bell/phone within reach;with chair alarm set     Time: 1130-1200 PT Time Calculation (min) (ACUTE ONLY): 30 min  Charges:  $Gait Training: 8-22 mins $Therapeutic Activity: 8-22 mins                    G Codes:      Rica Koyanagi  PTA [facility]  Acute  Rehab Pager      [phone]

## 2015-05-06 NOTE — Progress Notes (Signed)
Pt d/c'd to [facility]. Transported via car with daughter. Patient medicated for pain prior to d/c. Report given to RN at facility, [place].

## 2015-05-06 NOTE — Progress Notes (Signed)
Wound drainage is improved.  Continue with betadine paints to incisions BID.  Keflex 500 mg QID x 10 days Rx left in chart.  He is stable for dc to SNF from my stand point.  I will see him back in the office in 1-2 weeks for wound check.    Azucena Cecil, MD [place] 6:55 AM

## 2015-07-07 DEATH — deceased

## 2016-10-30 IMAGING — CT CT HIP*R* W/O CM
2 of 4 series · 16 of 46 positions shown, 18 images · non-contrast
Comparison: Right femur 04/28/2015

CLINICAL DATA: Right hip pain. Lesser trochanter fracture on plain
films.

EXAM:
CT OF THE RIGHT HIP WITHOUT CONTRAST
TECHNIQUE: Multidetector CT imaging of the right hip was performed according to
the standard protocol. Multiplanar CT image reconstructions were
also generated.

[Series 8: coronal st · coronal · 0.40mm/px · 3 of 77 slices shown]
[im 16/77  soft-tissue]
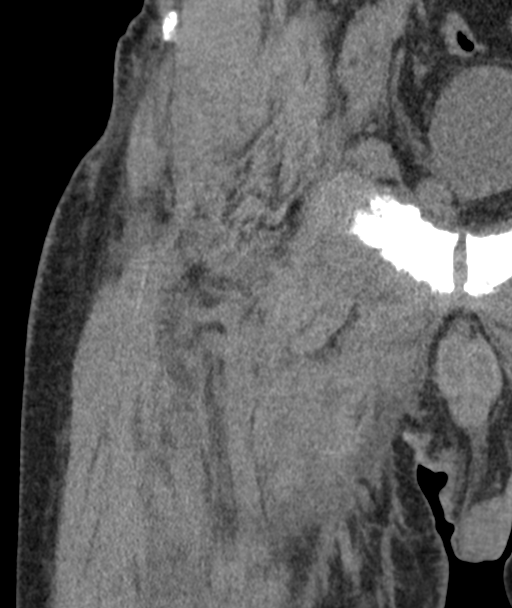
[im 31/77  soft-tissue]
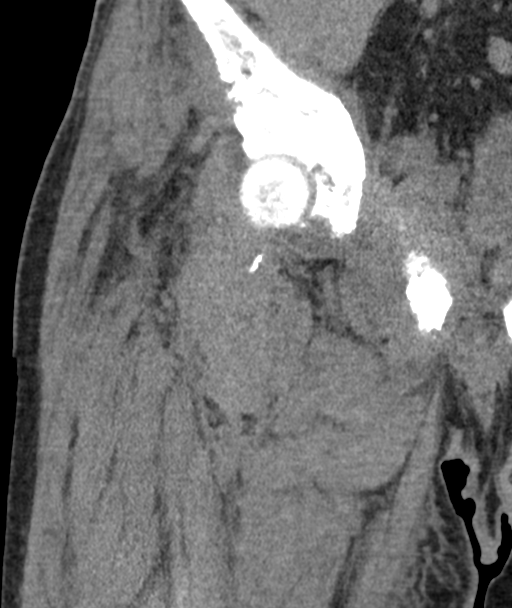
[im 46/77  soft-tissue]
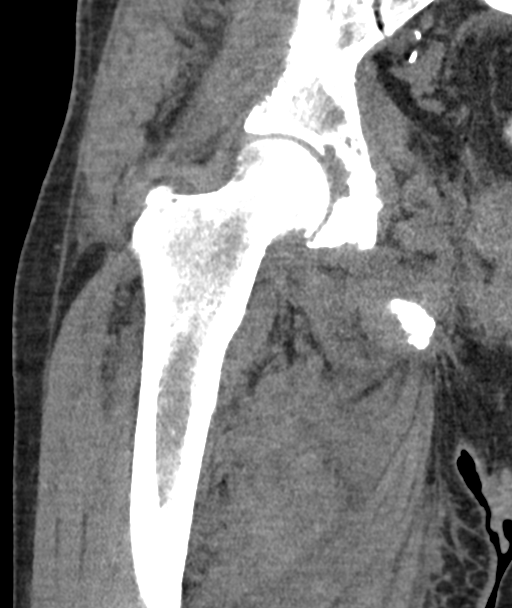

[Series 10: axial st · axial · 0.37mm/px · z∈[-210,+2]mm · 13 of 122 slices shown, 15 images]
[im 8/122  soft-tissue]
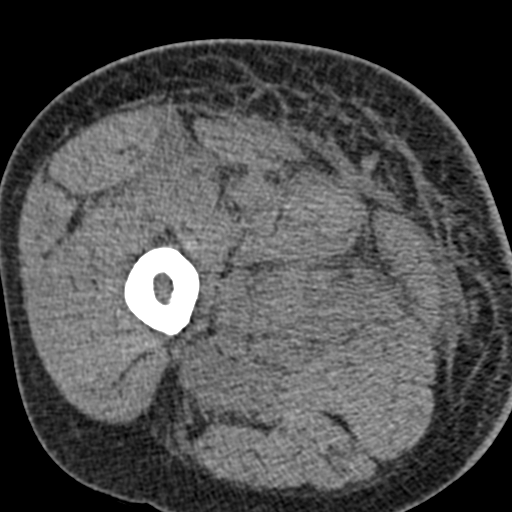
[im 8/122  bone]
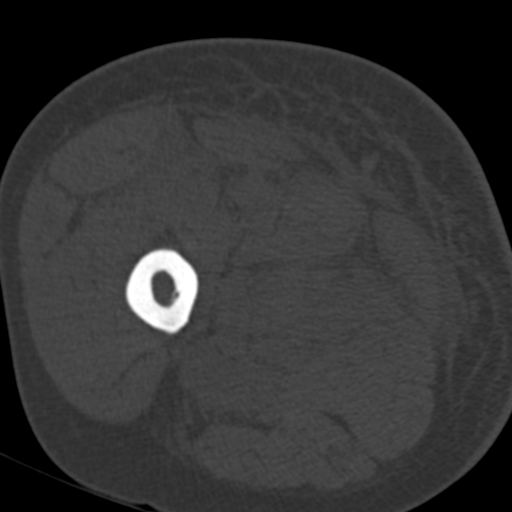
[im 16/122  soft-tissue]
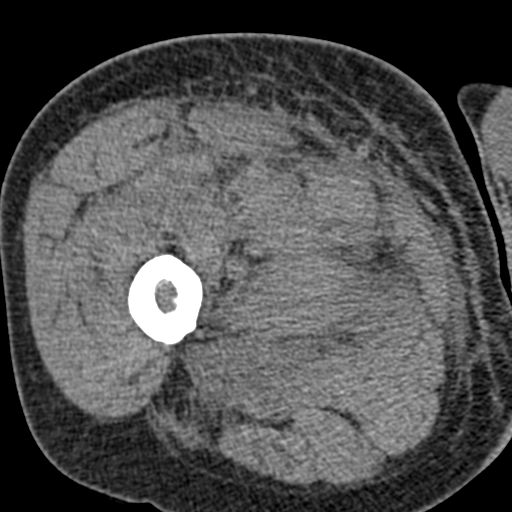
[im 24/122  soft-tissue]
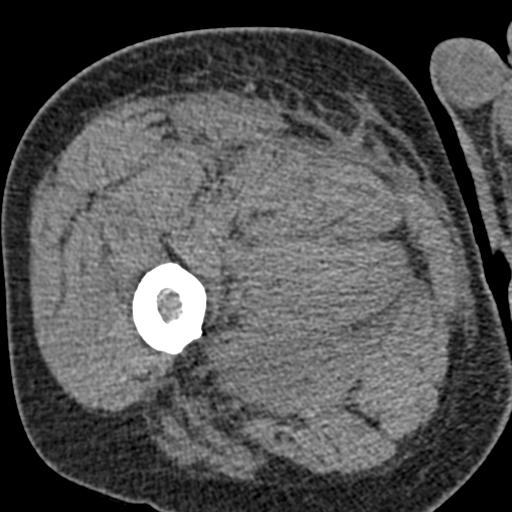
[im 36/122  soft-tissue]
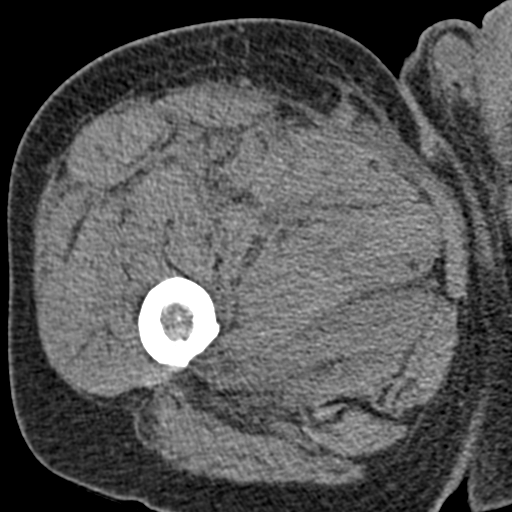
[im 43/122  soft-tissue]
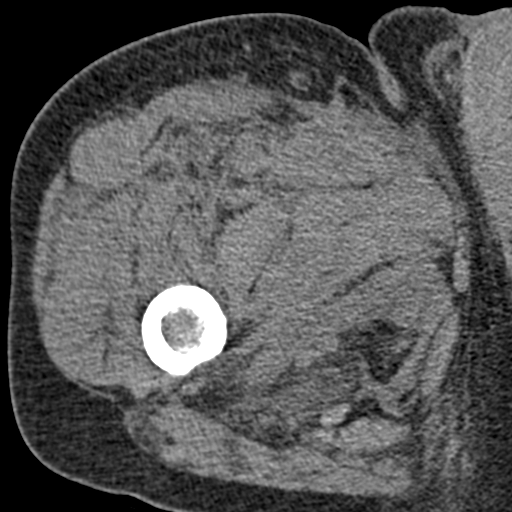
[im 51/122  soft-tissue]
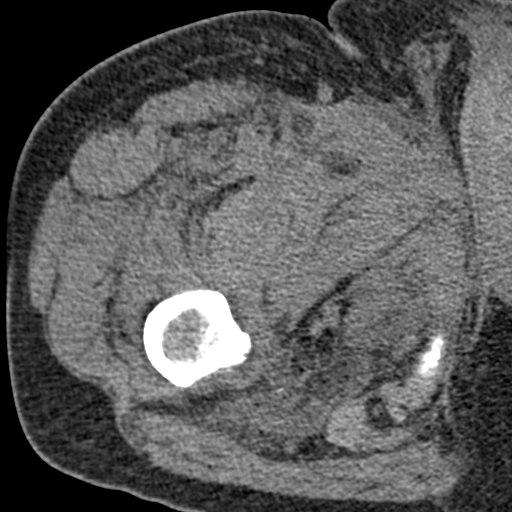
[im 63/122  soft-tissue]
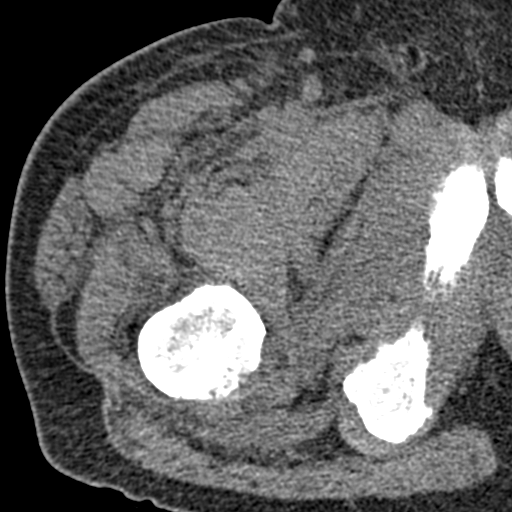
[im 71/122  soft-tissue]
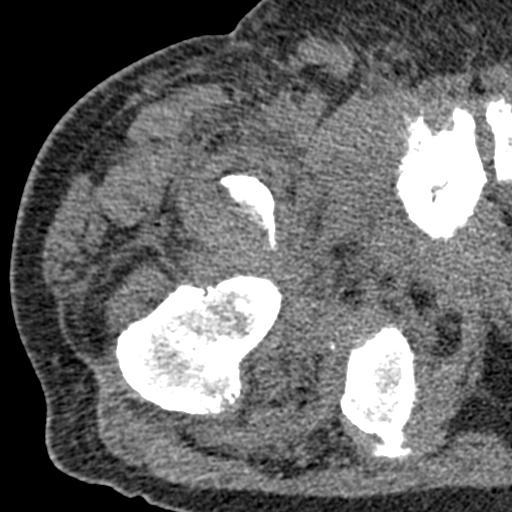
[im 79/122  soft-tissue]
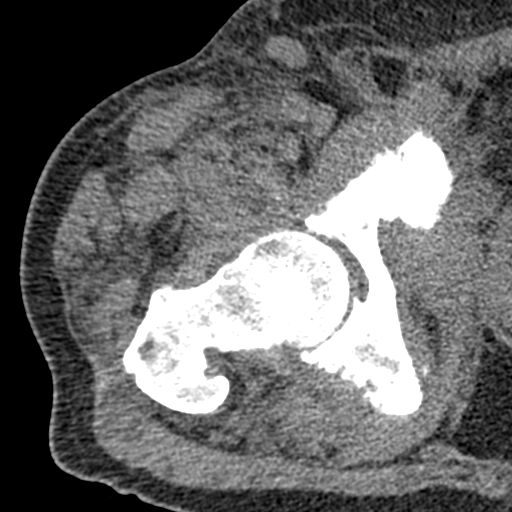
[im 79/122  bone]
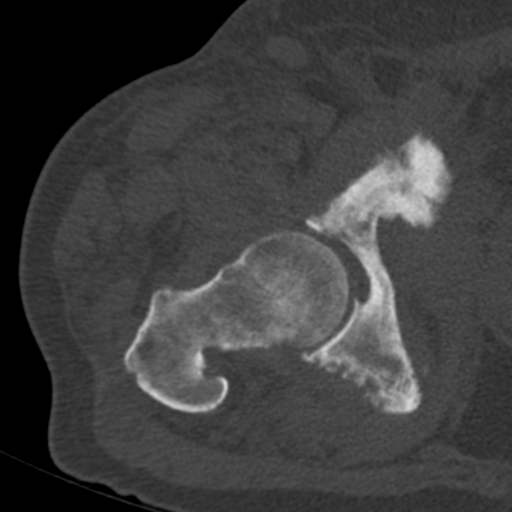
[im 86/122  soft-tissue]
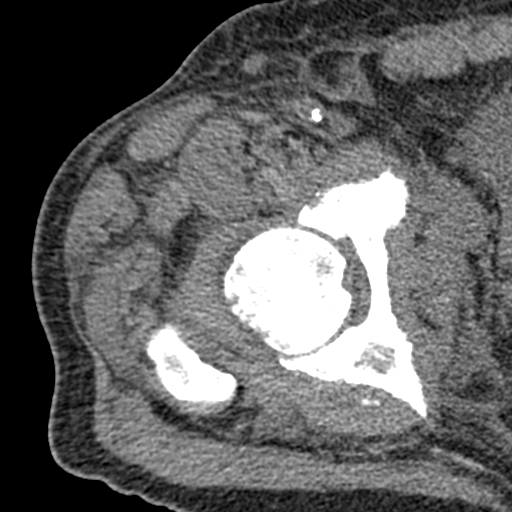
[im 98/122  soft-tissue]
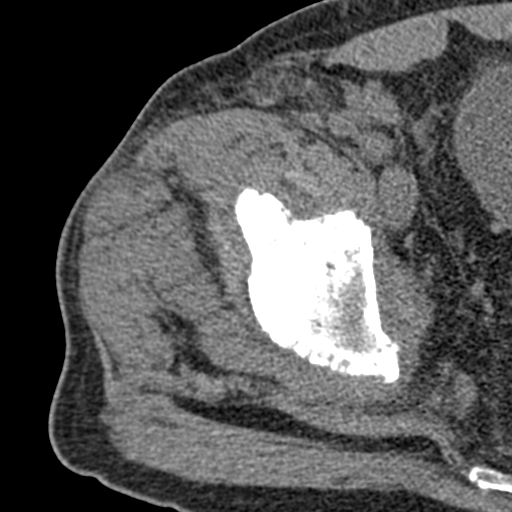
[im 106/122  soft-tissue]
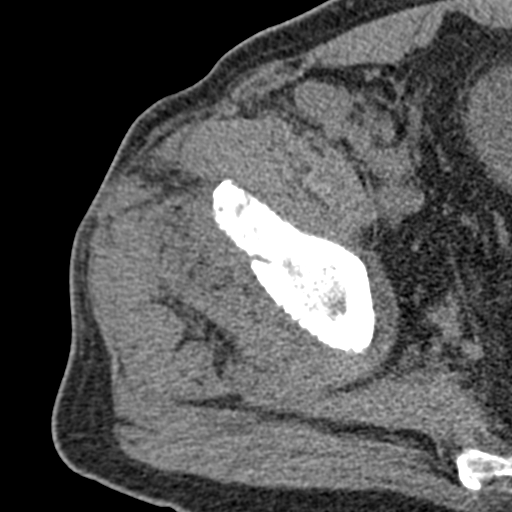
[im 114/122  soft-tissue]
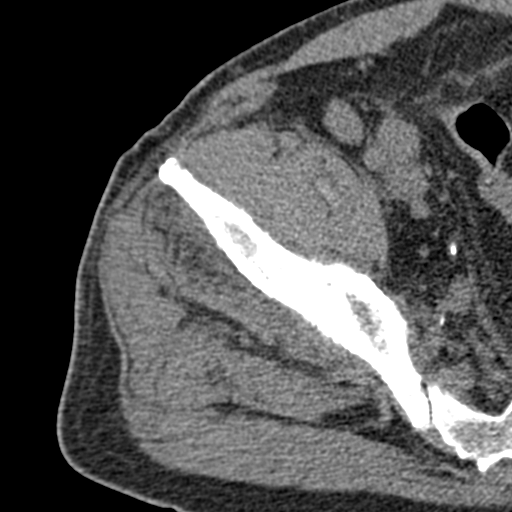

[16 of 46 positions shown; findings below may reference images not displayed]

FINDINGS: Again demonstrated is a fracture of the lesser trochanter with
superior and medial displacement of the lesser trochanteric
fragment. In addition, there is a linear lucency through the base of
the femoral neck consistent with a nondisplaced fracture. Linear
lucency also extends obliquely across the base of the greater
trochanter. No evidence of dislocation. On at 50 diffuse abnormal
bone density is demonstrated with a heterogeneous sclerosis and
lucency with irregular margination and irregular cortex demonstrated
throughout the visualized right pelvis and with heterogeneous
lucency and sclerosis in the femoral head and neck. This is
consistent with underlying infiltrative bone process. Suspect
metastatic involvement although myeloma or lymphoma could also
possibly have this appearance. Enlarged lymph nodes are demonstrated
in the right iliac chains, measuring up to 23 mm short axis
dimension. Musculature medial to the proximal femur is somewhat
prominent with hazy margins, suggesting intramuscular hematoma.
IMPRESSION: Displaced fracture of the lesser trochanter. Additional linear
nondisplaced fractures of the base of the femoral neck and of the
base of the greater trochanter. There is underlying abnormal bone
mineralization consistent with pathologic fractures. Bone sclerosis
and lucency with cortical irregularity may indicate metastasis,
myeloma, or other infiltrating bone process. Enlarged lymph nodes
are present in the right iliac chains suggesting metastatic
lymphadenopathy.
